# Patient Record
Sex: Female | Born: 1955 | Race: Black or African American | Hispanic: No | Marital: Married | Smoking: Never smoker
Health system: Southern US, Community
[De-identification: ages and names within clinical notes are randomized; demographics above are authoritative.]

## PROBLEM LIST (undated history)

## (undated) DIAGNOSIS — I428 Other cardiomyopathies: Secondary | ICD-10-CM

## (undated) DIAGNOSIS — J309 Allergic rhinitis, unspecified: Secondary | ICD-10-CM

## (undated) DIAGNOSIS — B20 Human immunodeficiency virus [HIV] disease: Secondary | ICD-10-CM

## (undated) DIAGNOSIS — D649 Anemia, unspecified: Secondary | ICD-10-CM

## (undated) DIAGNOSIS — I4729 Other ventricular tachycardia: Secondary | ICD-10-CM

## (undated) DIAGNOSIS — I5022 Chronic systolic (congestive) heart failure: Secondary | ICD-10-CM

## (undated) DIAGNOSIS — I472 Ventricular tachycardia: Secondary | ICD-10-CM

## (undated) DIAGNOSIS — F1011 Alcohol abuse, in remission: Secondary | ICD-10-CM

## (undated) DIAGNOSIS — I10 Essential (primary) hypertension: Secondary | ICD-10-CM

## (undated) HISTORY — DX: Human immunodeficiency virus (HIV) disease: B20

## (undated) HISTORY — DX: Other cardiomyopathies: I42.8

## (undated) HISTORY — DX: Anemia, unspecified: D64.9

## (undated) HISTORY — DX: Alcohol abuse, in remission: F10.11

## (undated) HISTORY — DX: Chronic systolic (congestive) heart failure: I50.22

## (undated) HISTORY — DX: Essential (primary) hypertension: I10

## (undated) HISTORY — DX: Ventricular tachycardia: I47.2

## (undated) HISTORY — PX: TUBAL LIGATION: SHX77

## (undated) HISTORY — DX: Other ventricular tachycardia: I47.29

## (undated) HISTORY — DX: Allergic rhinitis, unspecified: J30.9

## (undated) HISTORY — PX: CHOLECYSTECTOMY: SHX55

---

## 2003-11-03 ENCOUNTER — Inpatient Hospital Stay (HOSPITAL_COMMUNITY): Admission: EM | Admit: 2003-11-03 | Discharge: 2003-11-06 | Payer: Self-pay | Admitting: Emergency Medicine

## 2004-11-27 IMAGING — US US ABDOMEN COMPLETE
1 series · 13 of 25 positions shown · non-contrast
Comparison: none

CLINICAL DATA: Abdominal pain.
 ABDOMINAL ULTRASOUND, 11/03/03, 4104 HOURS

[Series 1: abdomen · 0.28mm/px · 13 of 89 slices shown]
[im 1/89]
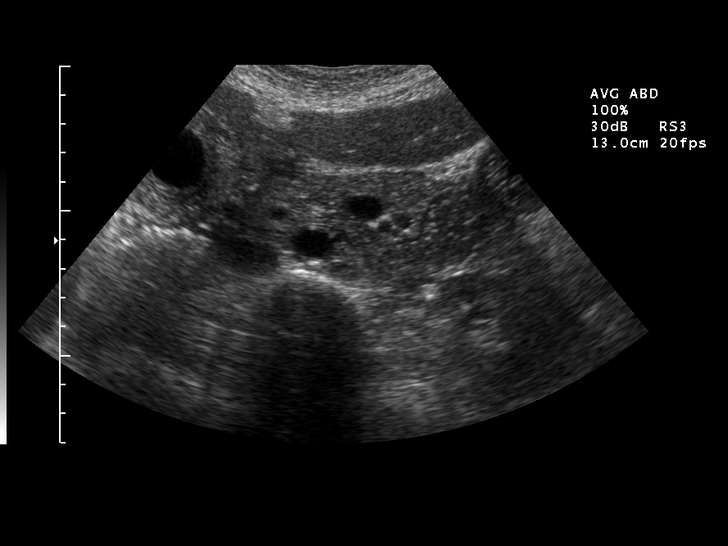
[im 8/89]
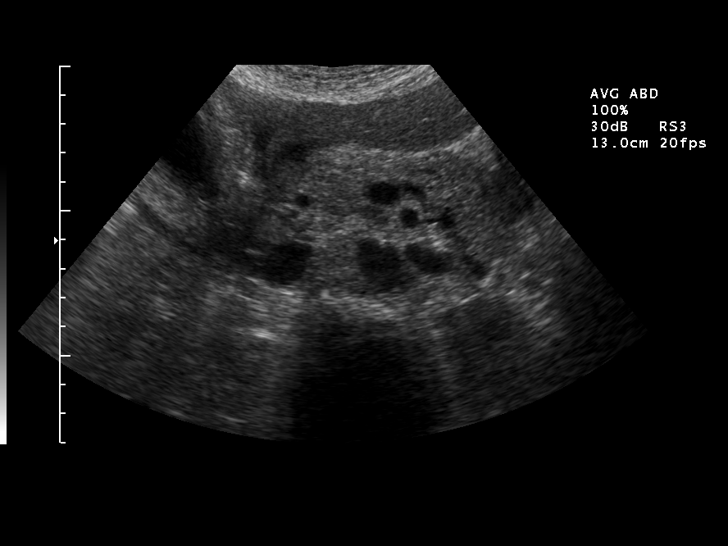
[im 15/89]
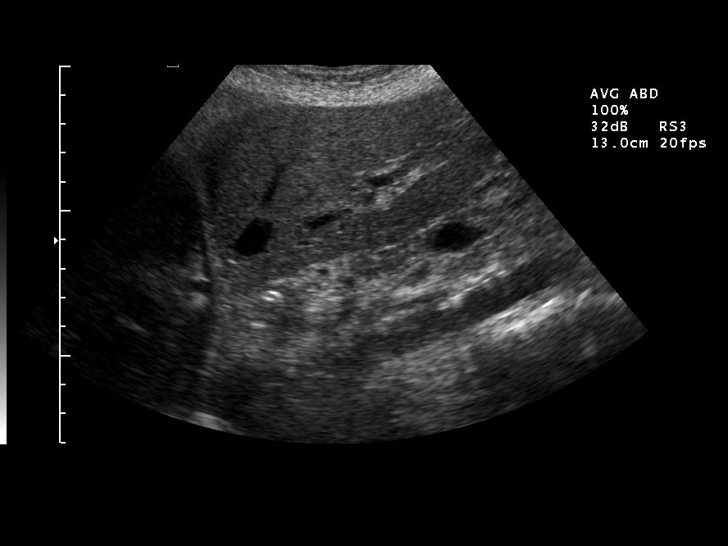
[im 23/89]
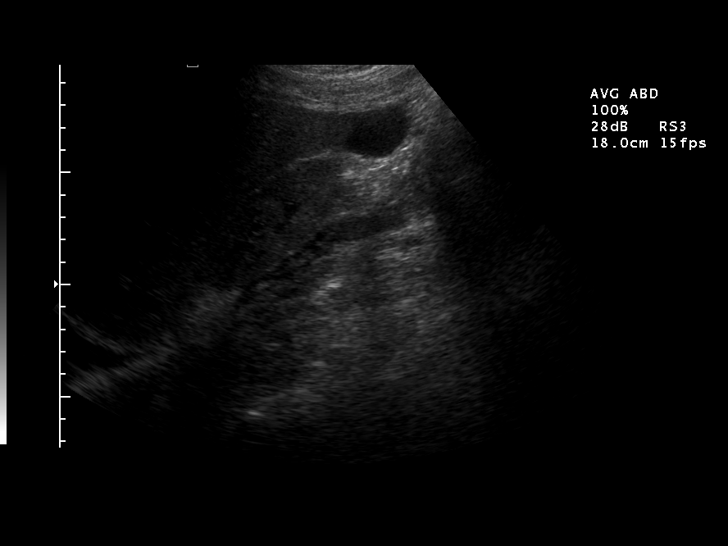
[im 30/89]
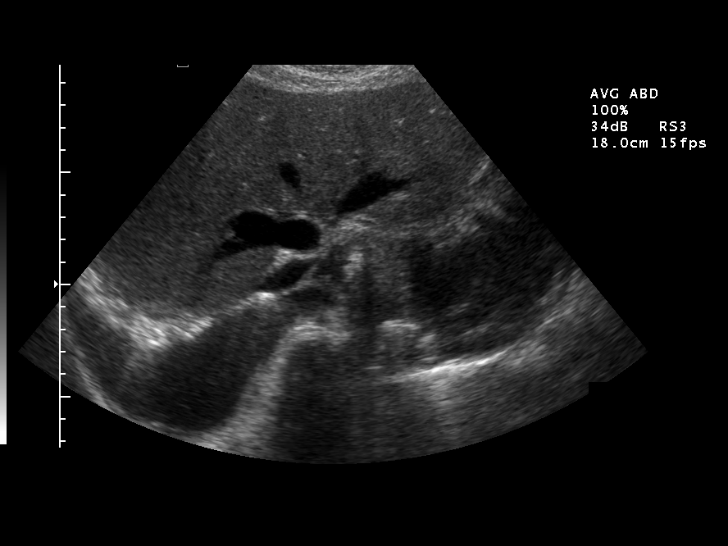
[im 37/89]
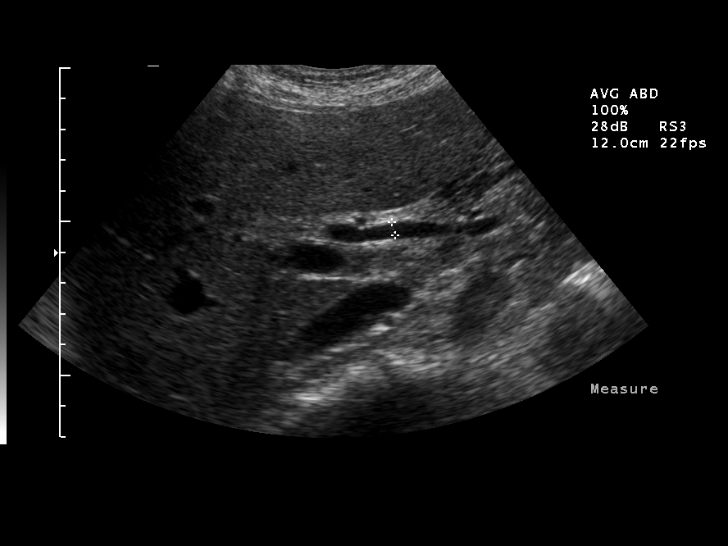
[im 45/89]
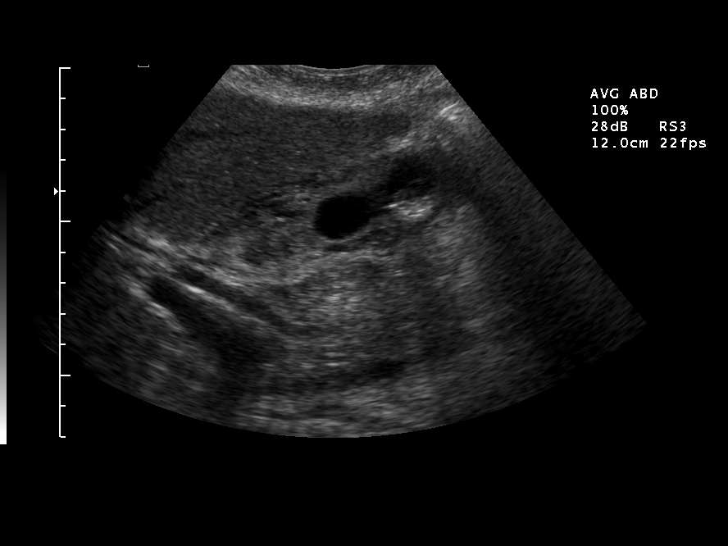
[im 52/89]
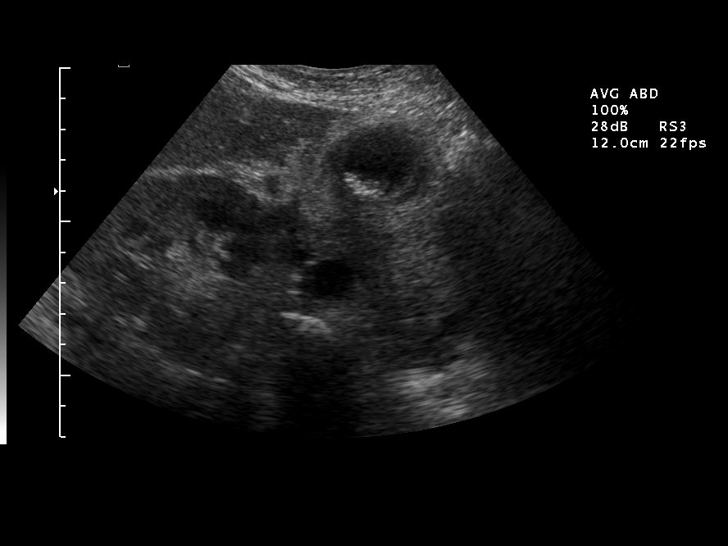
[im 59/89]
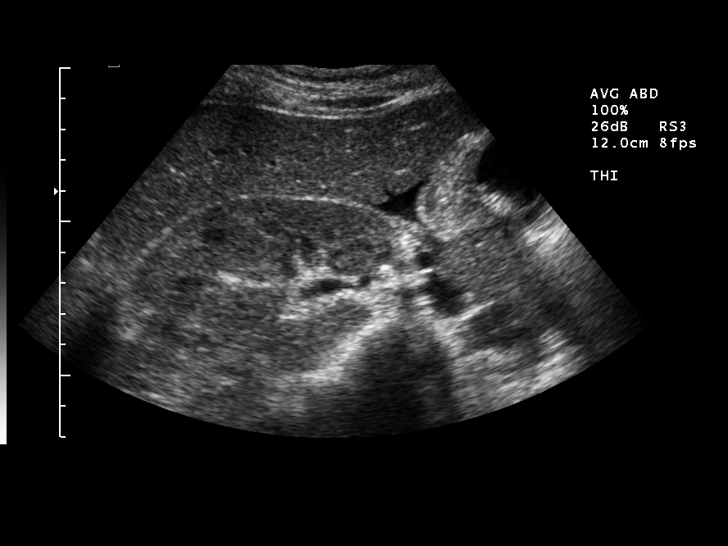
[im 67/89]
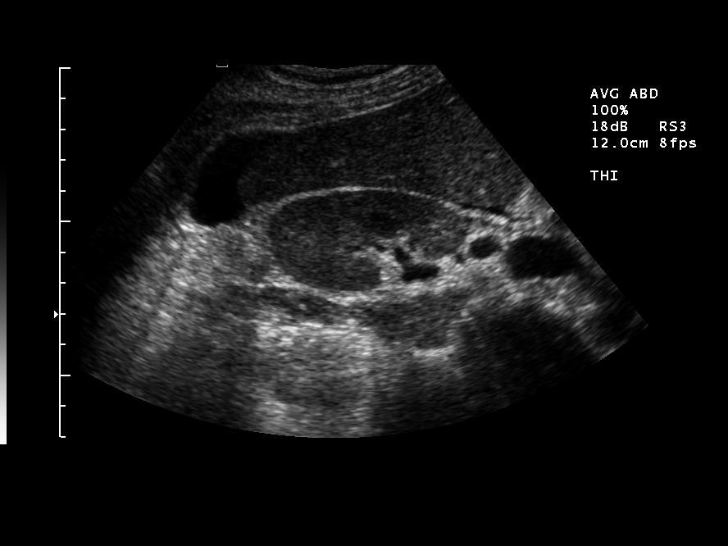
[im 74/89]
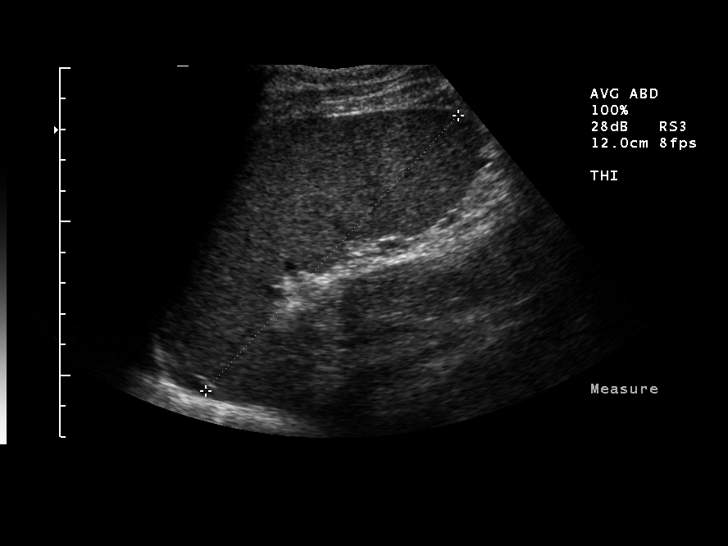
[im 81/89]
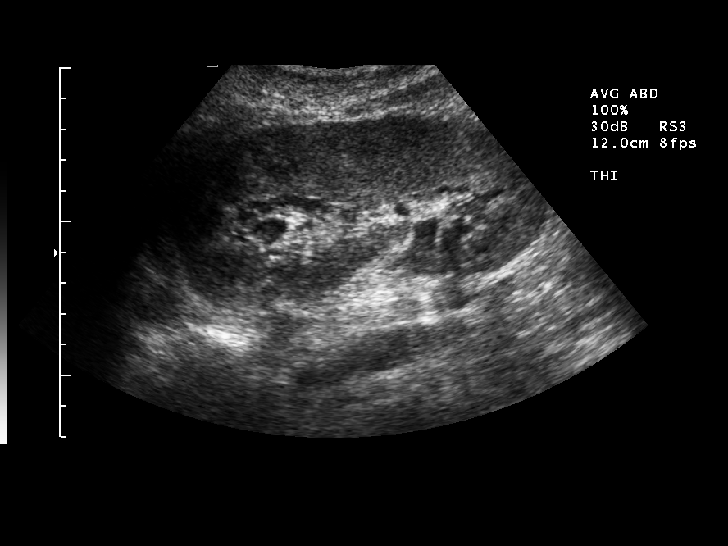
[im 89/89]
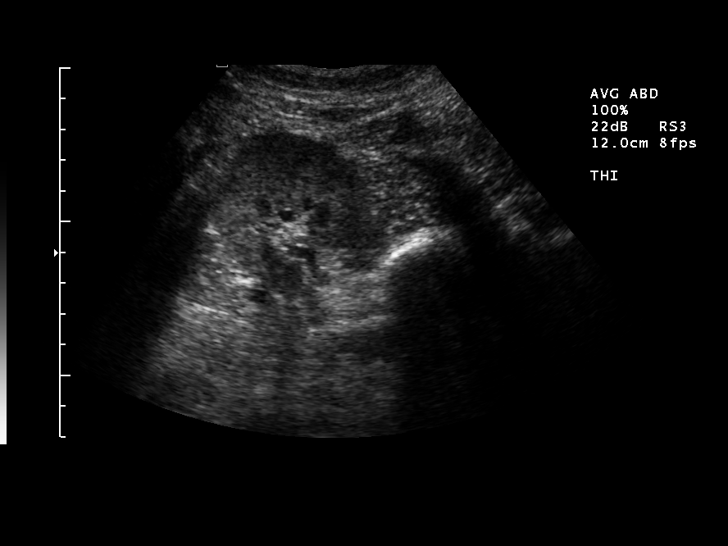

[13 of 25 positions shown; findings below may reference images not displayed]

FINDINGS: The gallbladder wall is significantly thickened and irregular, worrisome for inflammatory change.  The gallbladder wall is edematous.  Stones are seen within the gallbladder.  Pericholecystic fluid is also present.  The patient has somewhat diffuse abdominal tenderness and it was difficult to determine if there was a Murphy sign.  
 The common bile duct is 4.4 mm.
 The liver is normal in echogenicity and borderline enlarged measuring 18 cm in cephalocaudal measurement.
 The IVC is patent.  
 The pancreas is within normal limits without focal mass effect. 
 The spleen is within normal limits without focal mass effect.
 The right and left kidneys are 11.6 and 12.3 cm in length respectively without hydronephrosis or focal mass effect.
 Maximal aortic caliber in the proximal aorta is 1.5 cm.  The distal aorta was not clearly visualized due to overlying bowel gas.
 A small amount of free fluid is seen around the liver and gallbladder.
 A small left pleural effusion is present.
 IMPRESSION 
 Cholelithiasis with gallbladder wall thickening, wall edema, and pericholecystic fluid.  A Murphy sign was difficult to assess due to significant abdominal tenderness.  These findings are suggestive of acute cholecystitis but not entirely diagnostic.  Correlate clinically as for the need for nuclear medicine imaging. 
 Borderline hepatomegaly.
 Small amount of free fluid.
 Small left pleural effusion.

## 2006-09-08 ENCOUNTER — Ambulatory Visit: Payer: Self-pay | Admitting: Cardiology

## 2006-09-16 ENCOUNTER — Ambulatory Visit: Payer: Self-pay | Admitting: Cardiology

## 2006-09-21 ENCOUNTER — Ambulatory Visit: Payer: Self-pay | Admitting: Cardiology

## 2006-10-08 ENCOUNTER — Ambulatory Visit: Payer: Self-pay | Admitting: Cardiology

## 2007-04-05 ENCOUNTER — Ambulatory Visit: Payer: Self-pay | Admitting: Cardiology

## 2007-05-05 ENCOUNTER — Ambulatory Visit: Payer: Self-pay | Admitting: Cardiology

## 2007-05-24 ENCOUNTER — Ambulatory Visit: Payer: Self-pay | Admitting: Cardiology

## 2007-05-31 ENCOUNTER — Inpatient Hospital Stay (HOSPITAL_BASED_OUTPATIENT_CLINIC_OR_DEPARTMENT_OTHER): Admission: RE | Admit: 2007-05-31 | Discharge: 2007-05-31 | Payer: Self-pay | Admitting: Cardiology

## 2007-05-31 ENCOUNTER — Ambulatory Visit: Payer: Self-pay | Admitting: Cardiology

## 2007-06-13 ENCOUNTER — Ambulatory Visit: Payer: Self-pay | Admitting: Cardiology

## 2007-06-29 ENCOUNTER — Ambulatory Visit: Payer: Self-pay | Admitting: Cardiology

## 2007-09-22 ENCOUNTER — Ambulatory Visit: Payer: Self-pay | Admitting: Cardiology

## 2008-05-15 ENCOUNTER — Encounter: Payer: Self-pay | Admitting: Cardiology

## 2008-06-06 ENCOUNTER — Encounter: Payer: Self-pay | Admitting: Cardiology

## 2008-07-05 ENCOUNTER — Ambulatory Visit: Payer: Self-pay | Admitting: Cardiology

## 2008-07-24 ENCOUNTER — Ambulatory Visit: Payer: Self-pay | Admitting: Cardiology

## 2008-07-24 ENCOUNTER — Encounter: Payer: Self-pay | Admitting: Physician Assistant

## 2008-09-19 ENCOUNTER — Ambulatory Visit: Payer: Self-pay | Admitting: Internal Medicine

## 2008-09-19 DIAGNOSIS — I10 Essential (primary) hypertension: Secondary | ICD-10-CM | POA: Insufficient documentation

## 2008-09-19 DIAGNOSIS — I509 Heart failure, unspecified: Secondary | ICD-10-CM | POA: Insufficient documentation

## 2008-09-20 ENCOUNTER — Encounter (INDEPENDENT_AMBULATORY_CARE_PROVIDER_SITE_OTHER): Payer: Self-pay | Admitting: *Deleted

## 2009-02-22 ENCOUNTER — Encounter: Payer: Self-pay | Admitting: Cardiology

## 2009-04-05 ENCOUNTER — Encounter: Payer: Self-pay | Admitting: Cardiology

## 2009-04-05 DIAGNOSIS — I428 Other cardiomyopathies: Secondary | ICD-10-CM

## 2009-04-05 DIAGNOSIS — I517 Cardiomegaly: Secondary | ICD-10-CM | POA: Insufficient documentation

## 2009-04-05 DIAGNOSIS — I2510 Atherosclerotic heart disease of native coronary artery without angina pectoris: Secondary | ICD-10-CM | POA: Insufficient documentation

## 2009-06-03 ENCOUNTER — Encounter: Payer: Self-pay | Admitting: Cardiology

## 2009-06-20 ENCOUNTER — Ambulatory Visit: Payer: Self-pay | Admitting: Cardiology

## 2009-06-20 DIAGNOSIS — B20 Human immunodeficiency virus [HIV] disease: Secondary | ICD-10-CM

## 2010-03-18 ENCOUNTER — Ambulatory Visit: Payer: Self-pay | Admitting: Cardiology

## 2010-04-02 ENCOUNTER — Encounter: Payer: Self-pay | Admitting: Cardiology

## 2010-04-02 ENCOUNTER — Ambulatory Visit: Payer: Self-pay | Admitting: Cardiology

## 2010-06-24 NOTE — Assessment & Plan Note (Signed)
Summary: 6 MO FU R/S   Visit Type:  Follow-up Referring Provider:  Dr.Degent Primary Provider:  Dr. Illene Labrador [facility]); Glean Hess (Infection Diseases at [facility])   History of Present Illness: The patient returns for followup of her nonischemic cardiomyopathy. Since she was last seen here she saw Dr. Johney Frame.  They discussed an ICD. However, the patient has declined this at this point. She has also been diagnosed with HIV. This was found after she presented with weight loss and fatigue. She said that she now feels fine. She denies in particular any cardiovascular symptoms. She denies shortness of breath and is having no PND or orthopnea. She's had no weight gain or swelling. She has no chest discomfort, neck or arm discomfort. She has no palpitations, presyncope or syncope. She tolerates her medicines and avoid salt.  Preventive Screening-Counseling & Management  Alcohol-Tobacco     Smoking Status: never  Current Medications (verified): 1)  Ferrous Sulfate 325 (65 Fe) Mg Tabs (Ferrous Sulfate) .... Take 1 Tablet By Mouth Once A  Day 2)  Furosemide 40 Mg Tabs (Furosemide) .... Take 1 Tablet By Mouth Once A Day 3)  Lisinopril 20 Mg Tabs (Lisinopril) .... Take 1 Tablet By Mouth Once A Day 4)  Spironolactone 25 Mg Tabs (Spironolactone) .... Take 1 Tablet By Mouth Once A Day 5)  Coricidin Hbp Cough/cold 4-30 Mg Tabs (Chlorpheniramine-Dm) .... As Needed 6)  Eq Omeprazole 20 Mg Tbec (Omeprazole) .Marland Kitchen.. 1 By Mouth Once Daily 7)  Carvedilol 25 Mg Tabs (Carvedilol) .... Take One Tablet Two Times A Day 8)  Fish Oil   Oil (Fish Oil) .Marland Kitchen.. 1 By Mouth Once Daily 9)  Isentress 400 Mg Tabs (Raltegravir Potassium) .... Take 1 Tablet By Mouth Two Times A Day 10)  Norvir 100 Mg Tabs (Ritonavir) .... Take 1 Tablet By Mouth Two Times A Day 11)  Prezista 600 Mg Tabs (Darunavir Ethanolate) .... Take 1 Tablet By Mouth Two Times A Day 12)  Combivir 150-300 Mg Tabs (Lamivudine-Zidovudine) .... Take 1  Tablet By Mouth Two Times A Day 13)  Loratadine 10 Mg Tabs (Loratadine) .... Take 1 Tablet By Mouth Once A Day 14)  Nystatin 100000 Unit/ml Susp (Nystatin) .... Use As Directed  Allergies: 1)  Levaquin  Comments:  Nurse/Medical Assistant: Pt didn't bring medication list. Obtain medicine list from pt's pharmacies.  Cyril Loosen, RN, BSN (June 20, 2009 9:53 AM)  Past History:  Past Medical History: Nonischemic cardiopathy A.  normal coronary arteries, January 2009 B.  ejection fraction 30%; global hypokinesis Chronic systolic heart failure Hypertension Nonsustained ventricular tachycardia Chronic anemia Remote alcohol use Allergic sinusitis HIV  Past Surgical History: Cholecystectomy Tubal ligation  Review of Systems       As stated in the HPI and negative for all other systems.   Vital Signs:  Patient profile:   55 year old female Height:      61 inches Weight:      132.25 pounds BMI:     25.08 Pulse rate:   83 / minute BP sitting:   107 / 70  (left arm) Cuff size:   regular  Vitals Entered By: Cyril Loosen, RN, BSN (June 20, 2009 9:32 AM)  Nutrition Counseling: Patient's BMI is greater than 25 and therefore counseled on weight management options. Comments Follow up visit. Pt states recently diagnosed with HIV. She seeing ID clinic at [facility].    Physical Exam  General:  Well developed, well nourished, in no acute distress. Head:  normocephalic and atraumatic Eyes:  PERRLA/EOM intact; conjunctiva and lids normal. Neck:  Neck supple, no JVD. No masses, thyromegaly or abnormal cervical nodes. Chest Wall:  no deformities or breast masses noted Lungs:  Clear bilaterally to auscultation and percussion. Heart:  Non-displaced PMI, chest non-tender; regular rate and rhythm, S1, S2 without murmurs, rubs or gallops. Carotid upstroke normal, no bruit. Normal abdominal aortic size, no bruits. Femorals normal pulses, no bruits. Pedals normal pulses. No edema, no  varicosities. Abdomen:  Bowel sounds positive; abdomen soft and non-tender without masses, organomegaly, or hernias noted. No hepatosplenomegaly. Msk:  Back normal, normal gait. Muscle strength and tone normal. Extremities:  No clubbing or cyanosis. Neurologic:  Alert and oriented x 3. Skin:  Intact without lesions or rashes. Psych:  Normal affect.   Impression & Recommendations:  Problem # 1:  CARDIOMYOPATHY, DILATED (ICD-425.4) The patient has class I symptoms.  She is on an excellent medical regimen. Her blood pressure is borderline. Given the absence of symptoms and this I will not titrate her meds further. She does not want to consider an ICD at this time.  Problem # 2:  HYPERTENSION, BENIGN (ICD-401.1) Her blood pressure is controlled with the meds as listed.  Problem # 3:  HIV TEST POSITIVE (ICD-042) This is a new diagnosis for the patient. Her treating physicians are aware of her cardiac diagnosis.  Patient Instructions: 1)  Your physician recommends that you continue on your current medications as directed. Please refer to the Current Medication list given to you today. 2)  Your physician wants you to follow-up in: 6 MONTHS. You will receive a reminder letter in the mail about two months in advance. If you don't receive a letter, please call our office to schedule the follow-up appointment.

## 2010-06-24 NOTE — Letter (Signed)
Summary: Appointment- Rescheduled  [facility] at [facility] [address]   [place], [place] [address]   Phone: [phone]  Fax: [phone]     June 03, 2009 MRN: [id number]      [facility] [address] [place], [place]  [address]      Dear Ms. Coolman,   Due to a change in our office schedule, your appointment on JAN 27th with  Dr. Andee Lineman has been changed to Jan 28th with Dr. Antoine Poche @ 9:15.   Please call the office only if you wish not to keep appointment.   We look forward to participating in your health care needs.       Sincerely,  Glass blower/designer

## 2010-06-24 NOTE — Assessment & Plan Note (Signed)
Summary: 6 mo fu per july reminder   Visit Type:  Follow-up Referring Provider:  Dr.Aliviyah Malanga Primary Provider:  Path([place])   History of Present Illness: the patient is a 55 year old female with history of HIV [reportedly high CD4 count and undetectable viral load], nonischemic cardiomyopathy ejection fraction of 20% prior to aggressive medical heart failure treatment. The patient has been seen by Dr. Johney Frame for evaluation of ICD but she has declined. On medical therapy the patient has actually improved significantly. She now walks 2-1/2 miles a day. She denies any shortness of breath and she didn't like a class I.. She denies any palpitations presyncope or syncope. She denies any chest pain.  Electrocardiogram was performed in the office and reviewed. Reports have  been entered  Preventive Screening-Counseling & Management  Alcohol-Tobacco     Smoking Status: never  Current Medications (verified): 1)  Ferrous Sulfate 325 (65 Fe) Mg Tabs (Ferrous Sulfate) .... Take 1 Tablet By Mouth Once A  Day 2)  Furosemide 40 Mg Tabs (Furosemide) .... Take 1 Tablet By Mouth Once A Day 3)  Lisinopril 20 Mg Tabs (Lisinopril) .... Take 1 Tablet By Mouth Once A Day 4)  Spironolactone 25 Mg Tabs (Spironolactone) .... Take 1 Tablet By Mouth Once A Day 5)  Coricidin Hbp Cough/cold 4-30 Mg Tabs (Chlorpheniramine-Dm) .... As Needed 6)  Eq Omeprazole 20 Mg Tbec (Omeprazole) .Marland Kitchen.. 1 By Mouth Once Daily 7)  Carvedilol 25 Mg Tabs (Carvedilol) .... Take One Tablet Two Times A Day 8)  Fish Oil   Oil (Fish Oil) .Marland Kitchen.. 1 By Mouth Once Daily 9)  Isentress 400 Mg Tabs (Raltegravir Potassium) .... Take 1 Tablet By Mouth Two Times A Day 10)  Norvir 100 Mg Tabs (Ritonavir) .... Take 1 Tablet By Mouth Two Times A Day 11)  Prezista 600 Mg Tabs (Darunavir Ethanolate) .... Take 1 Tablet By Mouth Two Times A Day 12)  Combivir 150-300 Mg Tabs (Lamivudine-Zidovudine) .... Take 1 Tablet By Mouth Two Times A Day 13)  Loratadine 10 Mg  Tabs (Loratadine) .... Take 1 Tablet By Mouth Once A Day 14)  Nystatin 100000 Unit/ml Susp (Nystatin) .... Use As Directed  Allergies (verified): 1)  Levaquin  Comments:  Nurse/Medical Assistant: The patient is currently on medications but does not know the name or dosage at this time. Instructed to contact our office with details. Will update medication list at that time.  Past History:  Past Surgical History: Last updated: 06/20/2009 Cholecystectomy Tubal ligation  Family History: Last updated: 09-24-08 Father died at age 69 of CHF.  Multiple paternal relatives with "heart problems"  Social History: Last updated: 24-Sep-2008 Lives in [place] with daughter.  Works as a Comptroller.  Tob- none.  H/o moderate ETOH use but quit x 8 years.  Denies drugs.  Risk Factors: Smoking Status: never (03/18/2010)  Past Medical History: Nonischemic cardiopathy A.  normal coronary arteries, January 2009 B.  ejection fraction 20%; global hypokinesis Chronic systolic heart failure Hypertension Nonsustained ventricular tachycardia declined ICD therapy Chronic anemia Remote alcohol use Allergic sinusitis HIV undetectable viral load 2011  Vital Signs:  Patient profile:   56 year old female Height:      61 inches Weight:      164 pounds BMI:     31.10 Pulse rate:   93 / minute BP sitting:   137 / 91  (left arm) Cuff size:   regular  Vitals Entered By: Carlye Grippe (March 18, 2010 9:33 AM)  Nutrition Counseling: Patient's BMI is  greater than 25 and therefore counseled on weight management options.  Physical Exam  Additional Exam:  General: Well-developed, well-nourished in no distress head: Normocephalic and atraumatic eyes PERRLA/EOMI intact, conjunctiva and lids normal nose: No deformity or lesions mouth normal dentition, normal posterior pharynx neck: Supple, no JVD.  No masses, thyromegaly or abnormal cervical nodes lungs: Normal breath sounds bilaterally without  wheezing.  Normal percussion heart: regular rate and rhythm with normal S1 and S2, no S3 or S4.  PMI is normal.  No pathological murmurs abdomen: Normal bowel sounds, abdomen is soft and nontender without masses, organomegaly or hernias noted.  No hepatosplenomegaly musculoskeletal: Back normal, normal gait muscle strength and tone normal pulsus: Pulse is normal in all 4 extremities Extremities: No peripheral pitting edema neurologic: Alert and oriented x 3 skin: Intact without lesions or rashes cervical nodes: No significant adenopathy psychologic: Normal affect    EKG  Procedure date:  03/18/2010  Findings:      normal sinus rhythm. Nonspecific T-wave changes heart rate 86 beats per minute  Impression & Recommendations:  Problem # 1:  HIV TEST POSITIVE (ICD-042) viral load undetectable and followed at [facility] patient is on HAART  Problem # 2:  CARDIOMYOPATHY, DILATED (ICD-425.4) the patient has a nonischemic cardiomyopathy for functional class however has markedly improved. She is on maximal heart failure therapy and we will recheck an echocardiogram to assess her ejection fraction Her updated medication list for this problem includes:    Furosemide 40 Mg Tabs (Furosemide) .Marland Kitchen... Take 1 tablet by mouth once a day    Lisinopril 20 Mg Tabs (Lisinopril) .Marland Kitchen... Take 1 tablet by mouth once a day    Spironolactone 25 Mg Tabs (Spironolactone) .Marland Kitchen... Take 1 tablet by mouth once a day    Carvedilol 25 Mg Tabs (Carvedilol) .Marland Kitchen... Take one tablet two times a day  Problem # 3:  HYPERTENSION, BENIGN (ICD-401.1) blood pressure is controlled and continue medical therapy. Her updated medication list for this problem includes:    Furosemide 40 Mg Tabs (Furosemide) .Marland Kitchen... Take 1 tablet by mouth once a day    Lisinopril 20 Mg Tabs (Lisinopril) .Marland Kitchen... Take 1 tablet by mouth once a day    Spironolactone 25 Mg Tabs (Spironolactone) .Marland Kitchen... Take 1 tablet by mouth once a day    Carvedilol 25 Mg  Tabs (Carvedilol) .Marland Kitchen... Take one tablet two times a day  Other Orders: EKG w/ Interpretation (93000) 2-D Echocardiogram (2D Echo)  Patient Instructions: 1)  Echo 2)  Follow up in  6 months Prescriptions: CARVEDILOL 25 MG TABS (CARVEDILOL) Take one tablet two times a day  #60 x 6   Entered by:   Hoover Brunette, LPN   Authorized by:   Lewayne Bunting, MD, [facility]   Signed by:   Hoover Brunette, LPN on 95/63/8756   Method used:   Electronically to        [facility]  [address] [phone]* (retail)       [address]       [place], [place]  [address]       Ph: [phone]       Fax: [phone]   RxID:   [id number] EQ OMEPRAZOLE 20 MG TBEC (OMEPRAZOLE) 1 by mouth once daily  #30 x 1   Entered by:   Hoover Brunette, LPN   Authorized by:   Lewayne Bunting, MD, [facility]   Signed by:   Hoover Brunette, LPN on 83/15/1761   Method used:   [facility]  [address] [phone]* (retail)       [address]       [place], [place]  [address]       Ph: [phone]       Fax: [phone]   RxID:   [id number] SPIRONOLACTONE 25 MG TABS (SPIRONOLACTONE) Take 1 tablet by mouth once a day  #30 x 6   Entered by:   Hoover Brunette, LPN   Authorized by:   Lewayne Bunting, MD, [facility]   Signed by:   Hoover Brunette, LPN on 23/76/2831   Method used:   [facility]        [facility]  [address] [phone]* (retail)       [address]       [place], [place]  [address]       Ph: [phone]       Fax: [phone]   RxID:   [id number] LISINOPRIL 20 MG TABS (LISINOPRIL) Take 1 tablet by mouth once a day  #30 x 6   Entered by:   Hoover Brunette, LPN   Authorized by:   Lewayne Bunting, MD, [facility]   Signed by:   Hoover Brunette, LPN on 38/02/1750   Method used:   [facility]        [facility]  [address] [phone]* (retail)       [address]       [place], [place]  [address]       Ph: [phone]       Fax: [phone]   RxID:   [id number] FUROSEMIDE 40 MG TABS (FUROSEMIDE) Take 1 tablet by mouth once a day  #30 x 6   Entered by:   Hoover Brunette, LPN    Authorized by:   Lewayne Bunting, MD, [facility]   Signed by:   Hoover Brunette, LPN on 98/33/8250   Method used:   [facility]        [facility]  [address] [phone]* (retail)       [address]       [place], [place]  [address]       Ph: [phone]       Fax: [phone]   RxID:   [id number] FERROUS SULFATE 325 (65 FE) MG TABS (FERROUS SULFATE) Take 1 tablet by mouth once a  day  #30 Tablet x 1   Entered by:   Hoover Brunette, LPN   Authorized by:   Lewayne Bunting, MD, [facility]   Signed by:   Hoover Brunette, LPN on 19/41/7408   Method used:   [facility]        [facility]  [address] [phone]* (retail)       [address]       [place], [place]  [address]       Ph: [phone]       Fax: [phone]   RxID:   [id number]

## 2010-08-15 ENCOUNTER — Other Ambulatory Visit: Payer: Self-pay | Admitting: Cardiology

## 2010-08-18 ENCOUNTER — Other Ambulatory Visit: Payer: Self-pay | Admitting: *Deleted

## 2010-08-18 MED ORDER — OMEPRAZOLE 20 MG PO CPDR: 20.0000 mg | DELAYED_RELEASE_CAPSULE | Freq: Every day | ORAL | Status: DC

## 2010-08-21 NOTE — Telephone Encounter (Signed)
[place] 

## 2010-10-07 NOTE — Cardiovascular Report (Signed)
Penny [name], Penny [name]            ACCOUNT NO.:  [id number]   MEDICAL RECORD NO.:  [id number]          PATIENT TYPE:  OIB   LOCATION:  1962                         FACILITY:  [facility]   PHYSICIAN:  Rollene Rotunda, MD, FACCDATE OF BIRTH:  04-26-1956   DATE OF PROCEDURE:  05/31/2007  DATE OF DISCHARGE:                            CARDIAC CATHETERIZATION   CARDIOLOGIST:  Learta Codding, M.D., in [place].   PROCEDURE:  Left and right heart catheterization/coronary arteriography.   INDICATIONS:  Evaluate patient with cardiomyopathy of unclear etiology.   PROCEDURE NOTE:  Left heart catheterization performed via the right  femoral artery, right heart catheterization was performed via right  femoral vein.  Both vessels were cannulated using anterior wall  puncture.  A #4-French arterial sheath and #7-French venous sheath were  inserted via the modified Seldinger technique.  Preformed Judkins,  pigtail and a Swan-Ganz catheter were utilized.  The patient tolerated  the procedure well and left lab in stable condition.   RESULTS:  Hemodynamics:  RA mean 3 , RV 28/3, PA 23/9 with a mean of 15,  pulmonary capillary pressure mean 6, LV 150/14, AO 154/83.  Cardiac  output/cardiac index 3.3/1.93.  Coronaries:  Left main had separate ostia.  The LAD was normal.  First  and second diagonal were small and normal.  Circumflex was dominant.  In  the AV groove, it was normal.  There was mid obtuse marginal which was  large and normal.  Ramus intermediate was large and normal.  The PDA was  small to moderate sized and normal.  The right coronary artery is  nondominant and normal.  Left ventriculogram:  Left ventriculogram was obtained in the RAO  projection.  The EF was 30% with a global hypokinesis.   CONCLUSION:  Normal coronary arteries.  Nonischemic cardiomyopathy.  The  right heart pressures are within normal limits.   PLAN:  The patient will continue to have medical management of her  nonischemic  cardiomyopathy.      Rollene Rotunda, MD, [facility]  Electronically Signed     JH/MEDQ  D:  05/31/2007  T:  05/31/2007  Job:  [id number]   cc:   Rayburn Ma

## 2010-10-07 NOTE — Assessment & Plan Note (Signed)
[facility]                          [facility] NOTE   NAME:[name], [name]                   MRN:          [id number]  DATE:06/13/2007                            DOB:          [birth year]    PRIMARY CARE PHYSICIAN:  She is followed at [facility]  [place].   CARDIOLOGIST:  Dr. Andee Lineman.   REASON FOR VISIT:  Post catheterization follow-up.   HISTORY OF PRESENT ILLNESS:  Penny [name] is a 55 year old female [name] history of severe cardiomyopathy with previous EF of 20% improved  to 35-40% by echocardiogram in September 2008.  She also has a history  of non-sustained ventricular tachycardia.  There had always been concern  that she had underlying ischemic heart disease versus hypertensive heart  disease.  She was last seen by Dr. Andee Lineman on May 24, 2007.  At that  time, she was willing to proceed with cardiac catheterization.   She presented to [facility] October 29, 2007 for outpatient cardiac  catheterization.  This was done by Dr. Antoine Poche and revealed normal  coronaries and an EF 30% with global hypokinesia.   Penny [name] presents for follow-up.  Overall she is doing well.  She  denies any significant shortness of breath or chest pain.  She describes  NYHA class I symptoms.  She denies orthopnea, PND or pedal edema.  Denies any syncope or palpitations.   CURRENT MEDICATIONS:  Spironolactone 25 mg daily.  Ferrous sulfate 325  mg daily, aspirin 81 mg daily, Lasix 40 mg day, Lexapro 4 mg daily,  carvedilol 12.5 mg 2 tabs b.i.d.   PHYSICAL EXAM:  She is a well-nourished, well-developed female.  Blood pressure 160/101, pulse 85, weight 145.6 pounds.  HEENT is normal.  Neck without JVD.  CARDIAC:  Normal S1, S2.  Regular rate and rhythm without murmur.  LUNGS:  Clear to auscultation bilaterally without wheezing, rhonchi,  rales up.  ABDOMEN:  Soft.  Nontender with normoactive bowel sounds.  EXTREMITIES:  No edema.  Right femoral arteriotomy site without hematoma  or bruit.  NEUROLOGIC:  She is alert and oriented x3.  Cranial nerves 2-12 grossly  intact.   Electrocardiogram reveals sinus rhythm rate of 88 normal axis LVH.  Nonspecific ST-T wave change.  No significant change since previous  tracing.   IMPRESSION:  1. Nonischemic cardiomyopathy with an EF of 35-40%.  (Echocardiograms      May 05, 2007)  2. Chronic systolic congestive heart failure.  New [place] Heart      Association Class I symptoms.  3. Normal coronary arteries by cardiac catheterization October 29, 2007.  4. Hypertension.  5. History of nonsustained ventricular tachycardia.  6. Chronic anemia.  7. History of remote alcohol use.  8. History of sudden cardiac death in family with sudden death in      older family members.   PLAN:  Penny [name] presents to Penny office today for follow-up post  catheterization.  She had normal coronaries by cardiac catheterization.  At her office visit on December 30, Dr. Andee Lineman noted that if she has  ischemic  heart disease, she will need further evaluation of her  nonsustained ventricular tachycardia.  He felt that if she has a  nonischemic cardiomyopathy, up titration of her medications would be  indicated without necessitating defibrillator implant.  At this point in  time, we plan to:   1. Increase her lisinopril to 40 mg a day.  Penny [name] tells me she      is has not taken her medications today.  2. We will bring her back in 1 week for a BMET to follow-up on her      renal function potassium with Penny increase in her lisinopril.  3. We will bring her back in 2 weeks for a blood pressure check with      Penny nurse.  If her blood pressure remains elevated, we will      initiate nitrate therapy.  At that point, we will try to work      towards initiating Penny equivalent of BiDil.  4. We will bring Penny [name] back in 4 to 6 weeks for routine follow-      up.  She will  likely need another echocardiogram at some point in      Penny next several months to reassess her LV function given Penny up      titration of medical therapy.      Tereso Newcomer, PA-C  Electronically Signed      Learta Codding, MD,FACC  Electronically Signed   SW/MedQ  DD: 06/13/2007  DT: 06/13/2007  Job #: [id number]

## 2010-10-07 NOTE — Assessment & Plan Note (Signed)
[facility]                          [facility]   NAME:Hirata, JENAVIEVE FREDA                   MRN:          [id number]  DATE:09/22/2007                            DOB:          Sep 10, 1955    CARDIOLOGIST:  Dr. Lewayne Bunting.   REASON FOR VISIT:  Six week follow-up.   HISTORY OF PRESENT ILLNESS:  Ms. Martie Round is a 55 year old female patient  with a history of nonischemic cardiomyopathy with an EF of 20% improved  to 35-40% by last echocardiogram in December 2008.  She has normal  coronaries by catheterization January 2009.  I last saw her June 13, 2007.  In the office today she notes she is doing well.  She denies any  shortness of breath.  She exhibits NYHA class I symptoms.  She denies  any chest pain.  She denies orthopnea, PND, pedal edema, syncope, near  syncope or palpitations.  Since last being seen we tried to initiate  Isordil.  This caused significant GI upset.  She changed her lisinopril  herself to 40 mg at bedtime and 10 mg in the morning.  She also changed  her carvedilol to 25 mg two tablets at bedtime.   CURRENT MEDICATIONS:  1. Spironolactone 25 mg daily.  2. Ferrous sulfate 325 mg daily.  3. Aspirin 81 mg daily.  4. Lasix 40 mg daily.  5. Lisinopril 40 mg at bedtime.  6. Lisinopril 10 mg in the morning.  7. Claritin 10 mg q.h.s.  8. Carvedilol 25 mg two tablets at bedtime.   PHYSICAL EXAMINATION:  GENERAL:  She is a well-nourished, well-developed  female in no distress.  VITAL SIGNS:  Blood pressure 126/85, pulse 74, weight 143.2 pounds.  HEENT:  Normal.  NECK:  Without JVD.  CARDIAC:  Normal S1, S2, regular rate and rhythm without murmur.  LUNGS:  Clear to auscultation bilaterally.  ABDOMEN:  Soft, nontender.  EXTREMITIES:  Without edema.  NEUROLOGIC:  She is alert and oriented x3.  Cranial II-XII grossly  intact.   IMPRESSION:  1. Nonischemic cardiomyopathy with ejection fraction 35-40%.      a.      Chronic systolic congestive heart failure with [place]       Heart Association class I symptoms.  2. Normal coronary arteries by catheterization June 2009.  3. Hypertension, controlled.  4. History of nonsustained ventricular tachycardia.  5. Chronic anemia.  6. History of remote alcohol use.  7. Family history of sudden cardiac death.   PLAN:  1. Ms. Martie Round returns to the office today for follow-up for her      chronic systolic congestive heart failure secondary to nonischemic      cardiomyopathy.  She is doing well.  She is optivolemic on exam,      and she exhibits NYHA class I symptoms.  No medication changes need      to be made today.  However, she has changed her medicines around      somewhat, and we need to adjust those to treat her specifically for      congestive heart failure.  2.  She can continue on 40 mg of lisinopril at night and lisinopril 10      mg in the morning.  3. She would like a once daily formulation of carvedilol.  She has      fairly good prescription coverage.  We will change her carvedilol      to Coreg CR 80 mg a day.  Until she fills that prescription she      should take carvedilol 25 mg b.i.d.  4. We will check a BMET today to follow up on her renal function and      potassium as she is on spironolactone.  5. We will see her back in follow-up in six months.  We will check an      echocardiogram prior to that visit to reassess her LV function.      Tereso Newcomer, PA-C  Electronically Signed      Learta Codding, MD,FACC  Electronically Signed   SW/MedQ  DD: 09/22/2007  DT: 09/22/2007  Job #: [id number]   cc:   [facility]

## 2010-10-07 NOTE — Assessment & Plan Note (Signed)
[facility]                          [facility]   NAME:Haycraft, Penny [name]                   MRN:          [id number]  DATE:04/05/2007                            DOB:          1955/06/16    PRIMARY CARDIOLOGIST:  Learta Codding, MD, [facility]   REASON FOR VISIT:  Six month followup.   The patient continues to do extremely well from a cardiovascular  standpoint, with no interim development of signs/symptoms suggestive of  decompensated congestive heart failure.   The patient reports compliance with her medications, as well as dietary  sodium restriction. Of note, she has gained two pounds since our last  visit, but presents with no symptoms suggestive of congestive heart  failure.   CURRENT MEDICATIONS:  1. Spironolactone 25 daily.  2. Ferrous sulfate 325 daily.  3. Aspirin 81 daily.  4. Lisinopril 40 daily.  5. Carvedilol 18.75 daily.  6. Lasix 40 daily.   PHYSICAL EXAMINATION:  Blood pressure 134/88, pulse 87 and regular,  weight 148.6 (up 2 pounds).  GENERAL: A 55 -year-old female sitting upright in no distress.  HEENT: Normocephalic, atraumatic.  NECK: Palpable bilateral carotid pulses without bruits; no JVD at 90  degrees.  LUNGS:  Clear to auscultation in all fields.  HEART: Regular rate and rhythm (S1, S2). No significant murmurs.  ABDOMEN: Soft, nontender.  EXTREMITIES: No pedal edema.  NEURO: No focal deficit.   IMPRESSION:  1. Severe cardiomyopathy.      a.     Ejection fraction of 20%, by previous studies.      b.     Worrisome for ischemic etiology, per exercise stress       Cardiolite perfusion imaging in April 2008.      c.     Current [place] Heart Association Class I heart failure.  2. History of nonsustained ventricular tachycardia.  3. Hypertension.  4. Chronic anemia.  5. Remote history of alcohol use.   PLAN:  1. Followup 2D echo, as previously recommended, for assessment of any      left ventricular  recovery. If this shows a persistently depressed      left ventricular function, then we will need to revisit the option      of proceeding with diagnostic coronary angiography. We will review      this with the patient when she returns for  clinic followup.  2. Up titrate Coreg to target goal of 25 mg b.i.d. The patient has      been tolerating this medication quite nicely.  3. Surveillance blood work with a complete metabolic profile and BNP      level, particularly in light of the fact that she is on      spironolactone and furosemide as well as an ACE inhibitor.  4. Return clinic followup with myself and Dr. Andee Lineman in one month, for      review of echocardiogram results and further recommendations with      particular emphasis on whether or not to proceed with cardiac      catheterization, in light of her previous abnormal perfusion study.  5. Renew current medications with the addition of Claritin 10 mg      daily. The patient states that this has been effective in the past      for treatment of her chronic nasal congestion.      Gene Serpe, PA-C  Electronically Signed      Learta Codding, MD,FACC  Electronically Signed   GS/MedQ  DD: 04/05/2007  DT: 04/06/2007  Job #: [id number]

## 2010-10-07 NOTE — Assessment & Plan Note (Signed)
[facility]                          [facility]   NAME:Penny [name], Penny [name]                   MRN:          [id number]  DATE:07/05/2008                            DOB:          [birth year]    PRIMARY CARDIOLOGIST:  Learta Codding, MD,FACC   REASON FOR VISIT:  Scheduled followup.   [name] returns to our clinic for ongoing management of nonischemic  cardiomyopathy.  Her cardiac catheterization in January 2009 indicated  normal coronary arteries, but moderate LVD (EF 30%) with global  hypokinesis.  She has not had a followup echocardiogram for reassessment  of her LV function.   Clinically, she is doing extremely well.  She presents with symptoms  consistent with NYHA class I heart failure.  She also denies any chest  pain or tachy palpitations.   The patient was recently briefly treated here at [facility] ED  for pneumonia.  Review of blood work from that visit of 1 month ago  indicates normal renal function, sodium 130, and a potassium of 3.6.  WBC 12.5 with a hemoglobin of 11, hematocrit 33 (MCV 85), and platelets  130.   CURRENT MEDICATIONS:  1. Spironolactone 25 daily.  2. Lasix 40 daily.  3. Lisinopril 10 q.a.m.  4. Coreg CR 80 q.a.m.  5. Omeprazole 20 daily.  6. Ferrous sulfate 325 daily.   PHYSICAL EXAMINATION:  VITAL SIGNS:  Blood pressure 155/97, pulse 78 and  regular, weight 130 (down 13 pounds).  GENERAL:  A 55 year old female sitting upright in no distress.  HEENT:  Normocephalic, atraumatic.  NECK:  Palpable bilateral carotid pulses without bruits; no JVD at 90  degrees.  LUNGS:  Clear to auscultation in all fields.  HEART:  Regular rate and rhythm.  No significant murmurs.  No rubs.  ABDOMEN:  Benign.  EXTREMITIES:  No edema.  NEUROLOGIC:  No focal deficits.   IMPRESSION:  1. Nonischemic cardiomyopathy.      a.     EF 30% by catheterization, January 2009.      b.     Normal coronary arteries.  2. Hypertension, uncontrolled.  3. Chronic systolic heart failure.      a.     NYHA class I symptoms.  4. History of NSVT.  5. Remote alcohol use.  6. Chronic normocytic anemia.  7. Hyponitremia.   PLAN:  1. A 2D echocardiogram for reassessment of left ventricular function.  2. Increase lisinopril to 20 mg daily, for more aggressive blood      pressure control.  3. Followup BMET and BNP level in 1 week.  4. Schedule return clinic followup with myself and Dr. Andee Lineman in 6      months.      Gene Serpe, PA-C  Electronically Signed      Learta Codding, MD,FACC  Electronically Signed   GS/MedQ  DD: 07/05/2008  DT: 07/06/2008  Job #: [id number]   cc:   Demetrios Loll, PA

## 2010-10-07 NOTE — Assessment & Plan Note (Signed)
[facility]                          [facility]   NAME:Penny [name], Penny [name]                   MRN:          [id number]  DATE:05/24/2007                            DOB:          1956-03-18    HISTORY OF PRESENT ILLNESS:  The patient is a 55 year old African-  American female with a history of severe cardiomyopathy.  The patient  has a prior history of an ejection fraction of 20%.  The patient was  originally seen by me in April of 2008.  A Cardiolite study was ordered  at that time, which was felt to be abnormal and possibly consistent with  ischemic heart disease.  However, the patient did not want to proceed  with a catheterization at that point in time.  She was placed on medical  therapy and has seen Gene on several occasions now.  Medical therapy was  up-titrated and a followup echocardiogram was obtained.  This now  demonstrates an ejection fraction significantly improved to 35% to 40%.  Of note, also this patient has previously had nonsustained ventricular  tachycardia and when she was seen a year ago in [place],  recommendation was given to implant a defibrillator, which the patient  declined at the time.  Also, my initial impression in April 2008 was  that the patient likely has hypertensive heart disease with hypertensive  cardiomyopathy.  This was a postulated diagnosis prior to obtaining a  Cardiolite imaging study.  Of note also that, both on the Cardiolite and  the recent echocardiogram, the patient's ventricle was significantly  dilated.  The patient states that she is doing quite well now.  She has  no chest pain, shortness of breath, orthopnea, PND.  As a matter of  fact, she is an NYHA class I.  She also reports no palpitations.  On her  most recent EKG, no significant arrhythmias have been noted.   MEDICATIONS:  1. Spironolactone 25 mg p.o. daily.  2. Ferrous sulfate 325 daily.  3. Aspirin 81 mg a day.  4.  Lisinopril 10 mg p.o. daily.  5. Carvedilol 12.5 mg 1-1/2 tablet p.o. b.i.d.  6. Lasix 40 mg p.o. daily.   PHYSICAL EXAMINATION:  VITAL SIGNS:  Blood pressure 127/87, heart rate  is 83 beats per minute, weight 145 pounds.  NECK:  Normal carotid upstroke and no carotid bruits.  LUNGS:  Clear breath sounds bilaterally.  HEART:  Regular rate and rhythm.  Normal S1, S2.  No murmurs, rubs, or  gallops.  ABDOMEN:  Soft and nontender.  No rebound or guarding.  Good bowel  sounds.  EXTREMITIES:  No cyanosis, clubbing, or edema.  NEURO:  The patient is alert and oriented, grossly nonfocal.   PROBLEM LIST:  1. Cardiomyopathy.      a.     Rule out hypertensive heart disease.      b.     Rule out ischemic etiology with wall motion abnormalities by       echo.      c.     New [place] Heart Association class I.      d.  Ejection fraction improved to 35% to 40%.  2. History of nonsustained ventricular tachycardia.  3. Hypertension.  4. Chronic anemia.  5. History of remote alcohol use.  6. History of sudden cardiac death in family, with sudden death in      older family members.   PLAN:  1. The patient's lisinopril will be up-titrated to 20 mg p.o. daily.      Following this, further up-titration can be achieved off      carvedilol.  2. In reviewing all the data and noting that the patient has      persistent wall motion abnormalities by echo and an ejection      fraction that is not normalized, I have again addressed the issue      with the patient of cardiac catheterization.  She is now willing to      proceed.  The patient has consented to proceed with an outpatient      cardiac catheterization.  I have discussed risks and benefits with      the patient and she is willing to proceed.  3. If the patient has ischemic heart disease, further evaluation      regarding her nonsustained ventricular tachycardia is indicated.      If the patient has a nonischemic cardiomyopathy, I do think  further      up-titration of medications is warranted without necessitating      defibrillator implant.     Learta Codding, MD,FACC  Electronically Signed    GED/MedQ  DD: 05/24/2007  DT: 05/24/2007  Job #: [id number]

## 2010-10-10 NOTE — Letter (Signed)
September 04, 2008     RE:  Penny [name], Penny [name]  MRN:  [id number]  /  DOB:  [birth year]   To Whom It May Concern:   Penny [name] is a patient of mine with severe nonischemic cardiomyopathy.  Catheterization in 2009 indicated normal coronary arteries but with  significant LV dysfunction.  Her ejection fraction, although slightly  improved, remains subnormal.  Given the longstanding history of her  cardiomyopathy, it is unlikely that her LV function will significantly  improve over the next couple of years.  Fortunately, however, the  patient is in NYHA Class I and is responding well to her medical  therapy.  She, however, has a significant disability due to her  underlying severe nonischemic cardiomyopathy.   If you have any further questions regarding this patient's medical  condition, please do not hesitate to call me at my office at [phone].    Sincerely,      Learta Codding, MD,FACC  Electronically Signed    GED/MedQ  DD: 09/04/2008  DT: 09/04/2008  Job #: [id number]

## 2010-10-10 NOTE — Assessment & Plan Note (Signed)
[facility]                          [facility]   NAME:[name], [name]                   MRN:          [id number]  DATE:09/08/2006                            DOB:          27-Oct-1955    HISTORY OF PRESENT ILLNESS:  The patient is a 55 year old African  American female who was seen in the emergency room in February 2008 with  what appeared to be congestive heart failure. The reports to me that she  had fluid build up in the lungs. An EKG was done as well as an  echocardiogram. She states the results were good. I assumed that the  patient ruled out for myocardial infarction as no stress testing was  preformed. The patient is here now mainly for a second opinion.   The patient states that she has had for many years high blood pressure  which was probably uncontrolled. She also has a history of fluid build  up over the last several months, when she was placed on diuretics  during this last hospitalization, however she has felt much better. She  is currently NYHA class 1. She has no orthopnea or PND. She reports no  substernal chest pain on exertion. She has no palpitations or syncope.  In the office her EKG does show significant left ventricular hypertrophy  with secondary repolarization changes.   PAST MEDICAL HISTORY:  1. History of hypertension.  2. History of congestive heart failure.  3. History of cholecystectomy.   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 She  does not smoke or drink.   FAMILY HISTORY:  Notable for significant coronary artery disease at  premature age on her fathers side of the family.   MEDICATIONS:  1. Spironolactone 25 mg p.o. daily.  2. Ferrous sulfate 325 mg p.o. daily.  3. Lasix 40 mg p.o. b.i.d.  4. Carvedilol 12.5 mg p.o. b.i.d.  5. Aspirin 81 mg p.o. day.   REVIEW OF SYSTEMS:  As per HPI. The patient denies any nausea or  vomiting. No fever or chills. No melena or  hematochezia. No dysuria, or  frequency. No polyuria or polydipsia. No weakness or fatigue. No  arthritic complaints. No syncope. No reflux symptoms. The remainder of  the review of systems is negative.   PHYSICAL EXAMINATION:  Blood pressure 135/91, heart 81 beats per minute.  GENERAL: Well-nourished African American female in no apparent distress.  HEENT: Normal.  NECK EXAM: Normal carotid upstrokes. No carotid bruits. No thyromegaly.  JVD is 8 cm.  LUNGS: Clear breath sounds bilaterally. No wheezing.  HEART: Regular rate and rhythm with normal S1, S2. No murmur, rubs, or  gallops. The PMI is nondisplaced.  ABDOMEN: Soft, nontender. No rebound or guarding. Good bowel sounds.  EXTREMITY EXAM: Reveals no cyanosis, clubbing, or edema.  NEURO: Patient is alert, oriented, and grossly nonfocal.   A 12-lead electrocardiogram as outlined above.   PROBLEMS:  1. Hypertensive heart disease.  2. Status post recent congestive heart failure ( acute diastolic heart      failure).  3. Result of echo and EKG from [facility]  pending.  4. Ruled out for myocardial infarction.   PLAN:  1. The patient's blood pressure is still somewhat poorly controlled.      We will add lisinopril 10 mg p.o. daily to her medical regimen and      BMET will be drawn in 1 week to make sure the patient does not      develop hyperkalemia in conjunction with spironolactone.  2. The patient will follow up in 1 week with an exercise Cardiolite      study to make sure that she has no underlying ischemic heart      disease.  3. If the patient's perfusion study is positive she may require      cardiac catheterization but I do think that this is most likely      represents hypertensive disease with diastolic dysfunction.  4. Aggressive risk factor modification is indicated and I have made      otherwise no changes in her medical regimen.     Learta Codding, MD,FACC  Electronically Signed    GED/MedQ  DD: 09/08/2006   DT: 09/08/2006  Job #: [id number]

## 2010-10-10 NOTE — Assessment & Plan Note (Signed)
[facility]                          [facility]   NAME:Penny [name], Penny [name]                   MRN:          [id number]  DATE:09/21/2006                            DOB:          [birth year]    PRIMARY CARDIOLOGIST:  Dr. Lewayne Bunting.   REASON FOR VISIT:  Scheduled clinic followup.   The patient now returns to the clinic following recent exercise stress  Cardiolite per Dr. Lewayne Bunting for further evaluation of severe  cardiomyopathy.  The patient presents with no prior history of  documented coronary artery disease, and was recently hospitalized in  [place] in February of this year with congestive heart failure.  A  2D echo at that time revealed severe LV dysfunction (EF 15%) with global  hypokinesis and evidence of pulmonary hypertension, and mild mitral  regurgitation.  She was also noted to have documented non-sustained  ventricular tachycardia and was advised to undergo placement of a  defibrillator, but the patient declined.  There was no indication of any  plans for a cardiac catheterization.   The recent stress test, reviewed by Dr. Andee Lineman, was clinically and  electrocardiographically negative for ischemia, but perfusion data was  suggestive of a small/moderate anterior apical defect, which appeared to  be reversible; calculated ejection fraction at 19%.   Of note, the patient also had a 2D echo in June of 2005, reviewed by Dr.  Madolyn Frieze. [place], suggesting an ejection fraction of 10-20%.  She was  seen in consultation at that time by Dr. Rollene Rotunda for evaluation  of cardiomyopathy and prior history of congestive heart failure in 1998.  Of note, however, she was felt to be essentially asymptomatic by Dr.  Antoine Poche with perhaps, at most, NYHA class II classification at that  time.  She was also noted to have hypertension, and was started on an  ACE inhibitor at that time.   The patient tells me today, however, that she  was not on any medications  prior to her recent hospitalization in [place]; since then she  states she has done extremely well with no recurrent symptoms of  dyspnea.  She also denies any chest pain, tachy palpitations, orthopnea,  paroxysmal nocturnal dyspnea, or lower extremity edema.   CURRENT MEDICATIONS:  1. Coreg 12.5 b.i.d.  2. Low-dose aspirin.  3. Lasix 40 b.i.d.  4. Lisinopril 10 daily.  5. Spironolactone 25 daily.  6. Ferrous sulfate 325 daily.   Followup BMET on April 24 notable for BUN 15, creatinine 0.5, sodium  133, and potassium 4.4.   PHYSICAL EXAMINATION:  Blood pressure 144/91, pulse 92 and regular,  weight 146.  GENERAL:  A 55 year old female sitting upright in no distress.  HEENT:  Normocephalic, atraumatic.  NECK:  Palpable bilateral carotid pulses without bruits; no JVD.  LUNGS:  Clear to auscultation in all fields.  HEART:  Regular rate and rhythm (S1, S2).  No significant murmurs.  ABDOMEN:  Benign.  EXTREMITIES:  Trace pedal edema.  NEURO:  No focal deficits.   IMPRESSION:  1. Severe cardiomyopathy.      a.     Worrisome for  ischemic etiology per a recent stress       perfusion imaging study.      b.     Ejection fraction approximately 20%.      c.     Currently New [place] Heart Association class I.  2. History of nonsustained ventricular tachycardia.  3. Hypertension.  4. Chronic anemia.  5. History of alcohol use.   PLAN:  1. At this point, the patient is disinclined to proceed with      recommended cardiac catheterization to exclude ischemic etiology.      She has, however, agreed to return for clinic follow up and undergo      a repeat 2D echo in approximately 6 months.  If the repeat study      does not show improvement in her left ventricular function, then      she has agreed to undergo diagnostic coronary angiography.  2. Up-titrate Coreg to 18.75 b.i.d. with a target goal of 25 b.i.d.      for aggressive management of  cardiomyopathy.  3. Return clinic follow up in 2 weeks for a blood pressure and pulse      check.  4. Schedule return clinic followup with myself and Dr. Andee Lineman in 6      months.      Gene Serpe, PA-C       Learta Codding, MD,FACC    GS/MedQ  DD: 09/21/2006  DT: 09/21/2006  Job #: [id number]   cc:   of [facility]

## 2010-11-21 ENCOUNTER — Other Ambulatory Visit: Payer: Self-pay | Admitting: *Deleted

## 2010-11-21 MED ORDER — FERROUS SULFATE 325 (65 FE) MG PO TABS: 325.0000 mg | ORAL_TABLET | Freq: Every day | ORAL | Status: AC

## 2010-11-21 MED ORDER — OMEPRAZOLE 20 MG PO CPDR: 20.0000 mg | DELAYED_RELEASE_CAPSULE | Freq: Every day | ORAL | Status: DC

## 2011-02-12 LAB — POCT I-STAT 3, VENOUS BLOOD GAS (G3P V)
Acid-base deficit: 1
Bicarbonate: 23.8
O2 Saturation: 62
Operator id: 221371
TCO2: 25
pCO2, Ven: 39.7 — ABNORMAL LOW
pH, Ven: 7.386 — ABNORMAL HIGH
pO2, Ven: 33

## 2011-02-12 LAB — POCT I-STAT 3, ART BLOOD GAS (G3+)
Acid-base deficit: 2
Bicarbonate: 21.9
O2 Saturation: 96
Operator id: 221371
TCO2: 23
pCO2 arterial: 34.1 — ABNORMAL LOW
pH, Arterial: 7.416 — ABNORMAL HIGH
pO2, Arterial: 79 — ABNORMAL LOW

## 2011-03-13 ENCOUNTER — Other Ambulatory Visit: Payer: Self-pay | Admitting: *Deleted

## 2011-03-13 MED ORDER — CARVEDILOL 25 MG PO TABS: 25.0000 mg | ORAL_TABLET | Freq: Two times a day (BID) | ORAL | Status: DC

## 2011-06-07 ENCOUNTER — Other Ambulatory Visit: Payer: Self-pay | Admitting: Cardiology

## 2011-07-29 ENCOUNTER — Encounter: Payer: Self-pay | Admitting: *Deleted

## 2011-07-30 ENCOUNTER — Encounter: Payer: Self-pay | Admitting: Cardiology

## 2011-07-30 ENCOUNTER — Ambulatory Visit (INDEPENDENT_AMBULATORY_CARE_PROVIDER_SITE_OTHER): Payer: Medicare Other | Admitting: Cardiology

## 2011-07-30 DIAGNOSIS — I428 Other cardiomyopathies: Secondary | ICD-10-CM

## 2011-07-30 NOTE — Patient Instructions (Signed)
Continue all current medications. Your physician wants you to follow up in: 6 months.  You will receive a reminder letter in the mail one-two months in advance.  If you don't receive a letter, please call our office to schedule the follow up appointment   

## 2011-07-30 NOTE — Progress Notes (Signed)
Peyton Bottoms, MD, [facility] ABIM Board Certified in Adult Cardiovascular Medicine,Internal Medicine and Critical Care Medicine    CC: Followup patient nonischemic cardiomyopathy  HPI: cardiomyopathy ejection fraction 20%. The patient has declined implantation of an ICD. She's compliant with her medical regimen and she is on an excellent heart failure regimen. She reports no heart failure symptoms has not required any hospitalizations. She has no chest pain orthopnea or PND. She reports no presyncope or syncope. She exercises on a daily basis and is in NYHA class I. Laboratory work is performed by the ID clinic. The patient is a 56 year old female HIV positive with a nonischemic   PMH: reviewed and listed in Problem List in Electronic Records (and see below) Past Medical History  Diagnosis Date  . Nonischemic cardiomyopathy     A.  normal coronary arteries, January 2009 B.  ejection fraction 20%; global hypokinesis  . Chronic systolic heart failure   . Essential hypertension, benign   . Nonsustained ventricular tachycardia     declined ICD therapy  . Chronic anemia   . History of alcohol abuse   . Allergic sinusitis   . HIV (human immunodeficiency virus infection)     undetectable viral load 2011/followed at [facility]   Past Surgical History  Procedure Date  . Cholecystectomy   . Tubal ligation     Allergies/SH/FHX : available in Electronic Records for review  Allergies  Allergen Reactions  . Levofloxacin     REACTION: hives   History   Social History  . Marital Status: Married    Spouse Name: N/A    Number of Children: N/A  . Years of Education: N/A   Occupational History  . Not on file.   Social History Main Topics  . Smoking status: Never Smoker   . Smokeless tobacco: Never Used  . Alcohol Use: Not on file  . Drug Use: Not on file  . Sexually Active: Not on file   Other Topics Concern  . Not on file   Social History Narrative   Lives in [place]  with daughter. Works as a Comptroller.   Family History  Problem Relation Age of Onset  . Heart failure      Medications: Current Outpatient Prescriptions  Medication Sig Dispense Refill  . carvedilol (COREG) 25 MG tablet Take 1 tablet (25 mg total) by mouth 2 (two) times daily.  60 tablet  0  . Chlorpheniramine-DM (CORICIDIN COUGH/COLD) 4-30 MG TABS Take 1 tablet by mouth as needed.      . darunavir (PREZISTA) 600 MG tablet Take 600 mg by mouth 2 (two) times daily with a meal.      . ferrous sulfate 325 (65 FE) MG tablet Take 1 tablet (325 mg total) by mouth daily with breakfast.  30 tablet  6  . fish oil-omega-3 fatty acids 1000 MG capsule Take 1 g by mouth daily.      . furosemide (LASIX) 40 MG tablet Take 40 mg by mouth daily.      Marland Kitchen lamiVUDine-zidovudine (COMBIVIR) 150-300 MG per tablet Take 1 tablet by mouth 2 (two) times daily.      Marland Kitchen lisinopril (PRINIVIL,ZESTRIL) 20 MG tablet Take 20 mg by mouth daily.      Marland Kitchen loratadine (CLARITIN) 10 MG tablet Take 10 mg by mouth daily.      Marland Kitchen nystatin (MYCOSTATIN) 100000 UNIT/ML suspension Take 500,000 Units by mouth as directed.      Marland Kitchen omeprazole (PRILOSEC) 20 MG capsule TAKE ONE  CAPSULE BY MOUTH EVERY DAY  30 capsule  0  . raltegravir (ISENTRESS) 400 MG tablet Take 400 mg by mouth 2 (two) times daily.      . ritonavir (NORVIR) 100 MG TABS Take 100 mg by mouth 2 (two) times daily with a meal.      . spironolactone (ALDACTONE) 25 MG tablet Take 25 mg by mouth daily.        ROS: No nausea or vomiting. No fever or chills.No melena or hematochezia.No bleeding.No claudication  Physical Exam: BP 146/90  Pulse 75  Ht 5\' 1"  (1.549 m)  Wt 168 lb (76.204 kg)  BMI 31.74 kg/m2 General: Well-nourished.corrected female in no distress Neck: Normal carotid upstroke no carotid bruits. Lungs: Clear breath sounds bilaterally. No wheezing Cardiac: Regular rate and rhythm with normal S1-S2 no murmur rubs or gallops Vascular: No edema. Normal distal  pulses Skin: Warm and dry Physcologic: Normal affect  12lead ECG: Normal sinus rhythm nonspecific ST-T wave changes Limited bedside ECHO:N/A No images are attached to the encounter.    Patient Active Problem List  Diagnoses  . HIV TEST POSITIVE  . HYPERTENSION, BENIGN  . CARDIOMYOPATHY, DILATED-declined ICD implantation   . CHF  . LEFT VENTRICULAR FUNCTION, DECREASED best ejection fraction 20%   . VENTRICULAR HYPERTROPHY, LEFT    PLAN   Blood pressure elevated this morning but the patient did not take her medications yet.  There have been no heart failure admissions.  She is in NYHA class I and remains physically very active. No indication at the present time to repeat an echocardiogram.  The patient continues to decline implantation of ICD for primary prevention of sudden cardiac death.

## 2011-08-06 ENCOUNTER — Other Ambulatory Visit: Payer: Self-pay | Admitting: Cardiology

## 2011-08-07 ENCOUNTER — Ambulatory Visit: Payer: Self-pay | Admitting: Cardiology

## 2011-11-11 ENCOUNTER — Telehealth: Payer: Self-pay | Admitting: Cardiology

## 2011-11-11 NOTE — Telephone Encounter (Signed)
Left message for pharmacy to send refill request for ferrous sulfate to patients PCP stated that if they had any questions to give a call back to [phone]. Burnadette Pop, CMA

## 2013-02-14 ENCOUNTER — Ambulatory Visit: Payer: Medicare Other | Admitting: Cardiology

## 2013-02-16 ENCOUNTER — Ambulatory Visit: Payer: Medicare Other | Admitting: Cardiology

## 2013-03-07 ENCOUNTER — Ambulatory Visit: Payer: Medicare Other | Admitting: Cardiology

## 2013-05-04 ENCOUNTER — Ambulatory Visit: Payer: Medicare Other | Admitting: Cardiology

## 2013-06-07 ENCOUNTER — Ambulatory Visit: Payer: Medicare Other | Admitting: Cardiology

## 2013-06-27 ENCOUNTER — Ambulatory Visit (INDEPENDENT_AMBULATORY_CARE_PROVIDER_SITE_OTHER): Payer: Medicare Other | Admitting: Cardiology

## 2013-06-27 ENCOUNTER — Encounter: Payer: Self-pay | Admitting: Cardiology

## 2013-06-27 VITALS — BP 128/80 | HR 72 | Ht 61.0 in | Wt 158.0 lb

## 2013-06-27 DIAGNOSIS — I428 Other cardiomyopathies: Secondary | ICD-10-CM

## 2013-06-27 NOTE — Patient Instructions (Signed)
Your physician recommends that you schedule a follow-up appointment in: 3 months with Dr. Branch. This appointment will be scheduled today before you leave.   Your physician recommends that you continue on your current medications as directed. Please refer to the Current Medication list given to you today.   

## 2013-06-27 NOTE — Progress Notes (Addendum)
Clinical Summary Penny [name] is a 58 y.o.female former patient of Dr Andee Lineman, this is our first visit together. She is see for the following medical problems.  1. NICM - LVEF 20%, cath 2009 with patent coronaries per notes. - has refused ICD previously - no recent echo in our system, she thinks she may have had one at [facility] 2 years ago with SOB.  - compliant with meds.  - denies any SOB or DOE. Denies any exertional symptoms, walks up flights of stairs regularly without troubles. - limiting sodium intake, takes aleve 2 in morning and 2 and night.   Past Medical History  Diagnosis Date  . Nonischemic cardiomyopathy     A.  normal coronary arteries, January 2009 B.  ejection fraction 20%; global hypokinesis  . Chronic systolic heart failure   . Essential hypertension, benign   . Nonsustained ventricular tachycardia     declined ICD therapy  . Chronic anemia   . History of alcohol abuse   . Allergic sinusitis   . HIV (human immunodeficiency virus infection)     undetectable viral load 2011/followed at [facility]     Allergies  Allergen Reactions  . Levofloxacin     REACTION: hives     Current Outpatient Prescriptions  Medication Sig Dispense Refill  . carvedilol (COREG) 25 MG tablet Take 1 tablet (25 mg total) by mouth 2 (two) times daily.  60 tablet  0  . Chlorpheniramine-DM (CORICIDIN COUGH/COLD) 4-30 MG TABS Take 1 tablet by mouth as needed.      . darunavir (PREZISTA) 600 MG tablet Take 600 mg by mouth 2 (two) times daily with a meal.      . ferrous sulfate 325 (65 FE) MG tablet Take 1 tablet (325 mg total) by mouth daily with breakfast.  30 tablet  6  . fish oil-omega-3 fatty acids 1000 MG capsule Take 1 g by mouth daily.      . furosemide (LASIX) 40 MG tablet Take 40 mg by mouth daily.      Marland Kitchen lamiVUDine-zidovudine (COMBIVIR) 150-300 MG per tablet Take 1 tablet by mouth 2 (two) times daily.      Marland Kitchen lisinopril (PRINIVIL,ZESTRIL) 20 MG tablet  Take 20 mg by mouth daily.      Marland Kitchen loratadine (CLARITIN) 10 MG tablet Take 10 mg by mouth daily.      Marland Kitchen nystatin (MYCOSTATIN) 100000 UNIT/ML suspension Take 500,000 Units by mouth as directed.      Marland Kitchen omeprazole (PRILOSEC) 20 MG capsule TAKE ONE CAPSULE BY MOUTH EVERY DAY  30 capsule  0  . omeprazole (PRILOSEC) 20 MG capsule TAKE ONE CAPSULE BY MOUTH EVERY DAY  30 capsule  6  . raltegravir (ISENTRESS) 400 MG tablet Take 400 mg by mouth 2 (two) times daily.      . ritonavir (NORVIR) 100 MG TABS Take 100 mg by mouth 2 (two) times daily with a meal.      . spironolactone (ALDACTONE) 25 MG tablet Take 25 mg by mouth daily.       No current facility-administered medications for this visit.     Past Surgical History  Procedure Laterality Date  . Cholecystectomy    . Tubal ligation       Allergies  Allergen Reactions  . Levofloxacin     REACTION: hives      Family History  Problem Relation Age of Onset  . Heart failure       Social History Penny [name]  reports that she has never smoked. She has never used smokeless tobacco. Penny [name] has no alcohol history on file.   Review of Systems CONSTITUTIONAL: No weight loss, fever, chills, weakness or fatigue.  HEENT: Eyes: No visual loss, blurred vision, double vision or yellow sclerae.No hearing loss, sneezing, congestion, runny nose or sore throat.  SKIN: No rash or itching.  CARDIOVASCULAR: per HPI RESPIRATORY: No shortness of breath, cough or sputum.  GASTROINTESTINAL: No anorexia, nausea, vomiting or diarrhea. No abdominal pain or blood.  GENITOURINARY: No burning on urination, no polyuria NEUROLOGICAL: No headache, dizziness, syncope, paralysis, ataxia, numbness or tingling in the extremities. No change in bowel or bladder control.  MUSCULOSKELETAL: No muscle, back pain, joint pain or stiffness.  LYMPHATICS: No enlarged nodes. No history of splenectomy.  PSYCHIATRIC: No history of depression or anxiety.  ENDOCRINOLOGIC: No  reports of sweating, cold or heat intolerance. No polyuria or polydipsia.  Marland Kitchen.   Physical Examination p 72 bp 128/80 Wt 158 lbs BMI 30 Gen: resting comfortably, no acute distress HEENT: no scleral icterus, pupils equal round and reactive, no palptable cervical adenopathy,  CV: RRR, no m/r/g, no JVD, no carotid bruit Resp: Clear to auscultation bilaterally GI: abdomen is soft, non-tender, non-distended, normal bowel sounds, no hepatosplenomegaly MSK: extremities are warm, no edema.  Skin: warm, no rash Neuro:  no focal deficits Psych: appropriate affect   06/27/13 Clinic EKG Sinus rhythm  Assessment and Plan  1. NICM - no recent echo in our system, notes refer to most recent LVEF 20%. Patient reports she may have had a more recent echo done in [place], we will request records. She states she was told that everything was fine and she no longer needed an ICD, but I am not sure about the basis of this conversation as there is no documentation of a normalized LVEF. If no recent echo in [place], will repeat at next visit. - NYHA I, she is euvolemic today, we will continue current medications - will request labs from her PCP    Follow up 3 months  Antoine PocheJonathan F. Kacey Dysert, M.D., F.A.C.C.

## 2013-06-28 ENCOUNTER — Telehealth: Payer: Self-pay | Admitting: Cardiology

## 2013-06-28 DIAGNOSIS — I509 Heart failure, unspecified: Secondary | ICD-10-CM

## 2013-06-28 NOTE — Telephone Encounter (Signed)
Pt made aware and echocardiogram ordered and scheduled.

## 2013-06-28 NOTE — Telephone Encounter (Signed)
Message copied by Burnice Logan on Wed Jun 28, 2013 11:15 AM ------      Message from: Dina Rich F      Created: Tue Jun 27, 2013  9:55 AM       Please let patient know that we got the records from [place] and it does not appear that they did an echo. Please order an echo for her without contrast for CHF.            Dina Rich MD ------

## 2013-07-06 ENCOUNTER — Other Ambulatory Visit: Payer: Medicare Other

## 2013-07-12 ENCOUNTER — Telehealth: Payer: Self-pay | Admitting: Cardiology

## 2013-07-12 NOTE — Telephone Encounter (Signed)
Called Penny [name] in regards to re-scheduling her echo ordered by Dr.Branch. Left a message.

## 2013-08-02 ENCOUNTER — Other Ambulatory Visit (INDEPENDENT_AMBULATORY_CARE_PROVIDER_SITE_OTHER): Payer: Medicare Other

## 2013-08-02 DIAGNOSIS — I509 Heart failure, unspecified: Secondary | ICD-10-CM

## 2013-08-02 DIAGNOSIS — I428 Other cardiomyopathies: Secondary | ICD-10-CM

## 2013-08-08 ENCOUNTER — Telehealth: Payer: Self-pay | Admitting: Cardiology

## 2013-08-08 NOTE — Telephone Encounter (Signed)
Message copied by Burnice Logan on Tue Aug 08, 2013  2:21 PM ------      Message from: [place] F      Created: Mon Aug 07, 2013 10:12 PM       Please let patient know that echo shows her heart function still is not quite normal but is improved from prior studies.            Dina Rich MD ------

## 2013-08-08 NOTE — Telephone Encounter (Signed)
Pt informed of results and verbalized understanding.  

## 2013-10-13 ENCOUNTER — Ambulatory Visit: Payer: Medicare Other | Admitting: Cardiology

## 2013-10-31 ENCOUNTER — Encounter: Payer: Self-pay | Admitting: Cardiology

## 2013-10-31 ENCOUNTER — Ambulatory Visit (INDEPENDENT_AMBULATORY_CARE_PROVIDER_SITE_OTHER): Payer: Medicare Other | Admitting: Cardiology

## 2013-10-31 VITALS — BP 130/87 | HR 76 | Ht 61.0 in | Wt 158.0 lb

## 2013-10-31 DIAGNOSIS — I10 Essential (primary) hypertension: Secondary | ICD-10-CM

## 2013-10-31 DIAGNOSIS — I5022 Chronic systolic (congestive) heart failure: Secondary | ICD-10-CM

## 2013-10-31 DIAGNOSIS — E785 Hyperlipidemia, unspecified: Secondary | ICD-10-CM

## 2013-10-31 MED ORDER — SPIRONOLACTONE 25 MG PO TABS
25.0000 mg | ORAL_TABLET | Freq: Every day | ORAL | Status: AC
Start: 2013-10-31 — End: ?

## 2013-10-31 MED ORDER — CARVEDILOL 25 MG PO TABS: 25.0000 mg | ORAL_TABLET | Freq: Two times a day (BID) | ORAL | Status: AC

## 2013-10-31 NOTE — Patient Instructions (Signed)
Continue all current medications. Patient to call office with name of statin  Your physician wants you to follow up in: 6 months.  You will receive a reminder letter in the mail one-two months in advance.  If you don't receive a letter, please call our office to schedule the follow up appointment

## 2013-10-31 NOTE — Progress Notes (Signed)
Clinical Summary [name] is a 58 y.o.female seen today for follow up of the following medical problems.   1. NICM  - history of LVEF 20%, cath 2009 with patent coronaries per notes, had refused ICD previously  - repeat echo 07/2013 with LVEF 40-45%, grade I diastolic dysfunction.  - denies any SOB or DOE. Denies any exertional symptoms, walks up flights of stairs regularly without troubles.  - limiting sodium intake, compliant with meds  2. HTN - compliant with meds  3. Hyperlipidemia - reports started on statin by pcp, caused muscle aches. She is not sure which medication was started.    Past Medical History  Diagnosis Date  . Nonischemic cardiomyopathy     A.  normal coronary arteries, January 2009 B.  ejection fraction 20%; global hypokinesis  . Chronic systolic heart failure   . Essential hypertension, benign   . Nonsustained ventricular tachycardia     declined ICD therapy  . Chronic anemia   . History of alcohol abuse   . Allergic sinusitis   . HIV (human immunodeficiency virus infection)     undetectable viral load 2011/[facility]     Allergies  Allergen Reactions  . Levofloxacin     REACTION: hives     Current Outpatient Prescriptions  Medication Sig Dispense Refill  . carvedilol (COREG) 25 MG tablet Take 25 mg by mouth 2 (two) times daily.      . Chlorpheniramine-DM (CORICIDIN COUGH/COLD) 4-30 MG TABS Take 1 tablet by mouth as needed.      . darunavir (PREZISTA) 600 MG tablet Take 600 mg by mouth 2 (two) times daily with a meal.      . ferrous sulfate 325 (65 FE) MG tablet Take 1 tablet (325 mg total) by mouth daily with breakfast.  30 tablet  6  . fish oil-omega-3 fatty acids 1000 MG capsule Take 1 g by mouth daily.      . furosemide (LASIX) 40 MG tablet Take 40 mg by mouth daily.      Marland Kitchen. lamiVUDine-zidovudine (COMBIVIR) 150-300 MG per tablet Take 1 tablet by mouth 2 (two) times daily.      Marland Kitchen. lisinopril (PRINIVIL,ZESTRIL) 20 MG  tablet Take 20 mg by mouth daily.      Marland Kitchen. loratadine (CLARITIN) 10 MG tablet Take 10 mg by mouth daily.      Marland Kitchen. nystatin (MYCOSTATIN) 100000 UNIT/ML suspension Take 500,000 Units by mouth as directed.      Marland Kitchen. omeprazole (PRILOSEC) 20 MG capsule TAKE ONE CAPSULE BY MOUTH EVERY DAY  30 capsule  0  . raltegravir (ISENTRESS) 400 MG tablet Take 400 mg by mouth 2 (two) times daily.      . ritonavir (NORVIR) 100 MG TABS Take 100 mg by mouth 2 (two) times daily with a meal.      . spironolactone (ALDACTONE) 25 MG tablet Take 25 mg by mouth daily.       No current facility-administered medications for this visit.     Past Surgical History  Procedure Laterality Date  . Cholecystectomy    . Tubal ligation       Allergies  Allergen Reactions  . Levofloxacin     REACTION: hives      Family History  Problem Relation Age of Onset  . Heart failure       Social History [name] reports that she has never smoked. She has never used smokeless tobacco. [name] has no alcohol history on file.  Review of Systems CONSTITUTIONAL: No weight loss, fever, chills, weakness or fatigue.  HEENT: Eyes: No visual loss, blurred vision, double vision or yellow sclerae.No hearing loss, sneezing, congestion, runny nose or sore throat.  SKIN: No rash or itching.  CARDIOVASCULAR: per HPI RESPIRATORY: No shortness of breath, cough or sputum.  GASTROINTESTINAL: No anorexia, nausea, vomiting or diarrhea. No abdominal pain or blood.  GENITOURINARY: No burning on urination, no polyuria NEUROLOGICAL: No headache, dizziness, syncope, paralysis, ataxia, numbness or tingling in the extremities. No change in bowel or bladder control.  MUSCULOSKELETAL: No muscle, back pain, joint pain or stiffness.  LYMPHATICS: No enlarged nodes. No history of splenectomy.  PSYCHIATRIC: No history of depression or anxiety.  ENDOCRINOLOGIC: No reports of sweating, cold or heat intolerance. No polyuria or polydipsia.   Marland Kitchen   Physical Examination p 76 bp 130/87 Wt 158 lbs BMI 30 Gen: resting comfortably, no acute distress HEENT: no scleral icterus, pupils equal round and reactive, no palptable cervical adenopathy,  CV: RRR, no m/r/g, no JVD, no carotid bruits Resp: Clear to auscultation bilaterally GI: abdomen is soft, non-tender, non-distended, normal bowel sounds, no hepatosplenomegaly MSK: extremities are warm, no edema.  Skin: warm, no rash Neuro:  no focal deficits Psych: appropriate affect   Diagnostic Studies 08/02/13 Echo Study Conclusions  - Left ventricle: Mild hypokinesis of the anterior septum. Moderate hypokinesis base inferior-septal segment. Inferior wall difficult to fully assess. Images suggest significant hypokinesis. The EF is in the 40-45% raqnge. The cavity size was mildly dilated. Wall thickness was increased in a pattern of mild LVH. Doppler parameters are consistent with abnormal left ventricular relaxation (grade 1 diastolic dysfunction). - Mitral valve: Trivial regurgitation. - Left atrium: The atrium was mildly dilated. - Right ventricle: The cavity size was normal. Systolic function was normal.      Assessment and Plan  1. NICM  - LVEF has improved to 40-45%, she is NYHA I. She is euvolemic today.  - continue current medications  2. HTN - at goal, continue current meds  3. Hyperlipidemia - started on medicine by her pcp recently, she is not sure which one, and stopped on her own due to muscle aches - she is to give [place] a call tomorrow with the medication name, consider alternative potentially more mild statin. Will also request most recent labs from pcp   F/u 6 months    Antoine Poche, M.D., F.A.C.C.

## 2013-11-21 ENCOUNTER — Telehealth: Payer: Self-pay | Admitting: *Deleted

## 2013-11-21 NOTE — Telephone Encounter (Signed)
Patient was told to call back with name of her cholesterol medication that she was on.  Currently on Pravastatin 40mg  daily.  Most recent labs were requested from PMD & scanned in for your review.

## 2013-11-21 NOTE — Telephone Encounter (Signed)
Pravastatin is one of the more mild statins, if she had side effects to that one she likely will to other. I would try lovastatin 20mg  daily to see if she is able to tolerate   Dominga Ferry MD

## 2013-11-22 MED ORDER — LOVASTATIN 20 MG PO TABS
20.0000 mg | ORAL_TABLET | Freq: Every day | ORAL | Status: AC
Start: 2013-11-22 — End: ?

## 2013-11-22 NOTE — Telephone Encounter (Signed)
Patient notified.  She is willing to try the Lovastatin.  New rx sent to pharm.

## 2014-05-10 ENCOUNTER — Ambulatory Visit (INDEPENDENT_AMBULATORY_CARE_PROVIDER_SITE_OTHER): Payer: Medicare Other | Admitting: Cardiology

## 2014-05-10 ENCOUNTER — Encounter: Payer: Self-pay | Admitting: Cardiology

## 2014-05-10 VITALS — BP 139/83 | HR 82 | Ht 61.0 in | Wt 146.0 lb

## 2014-05-10 DIAGNOSIS — Z01810 Encounter for preprocedural cardiovascular examination: Secondary | ICD-10-CM

## 2014-05-10 DIAGNOSIS — E785 Hyperlipidemia, unspecified: Secondary | ICD-10-CM

## 2014-05-10 DIAGNOSIS — I10 Essential (primary) hypertension: Secondary | ICD-10-CM

## 2014-05-10 DIAGNOSIS — I5022 Chronic systolic (congestive) heart failure: Secondary | ICD-10-CM

## 2014-05-10 NOTE — Progress Notes (Signed)
Clinical Summary [name] is a 58 y.o.female seen today for follow up of the following medical problems.   1. NICM  - history of LVEF 20%, cath 2009 with patent coronaries per notes - repeat echo 07/2013 with LVEF 40-45%, grade I diastolic dysfunction.  - denies any SOB or DOE. Denies any exertional symptoms, walks up flights of stairs regularly without troubles.  - limiting sodium intake, compliant with meds - denies any LE edema.  - she reports she has not taken lisinopril for quite some time for unclear reasons   2. HTN - compliant with meds  3. Hyperlipidemia - tolerating lovastatin, previous muscle aches on other statins including pravastatin.   4. Preoperative evaluation - planned for hip replacement - no chest pain, no SOB, no DOE - walks up a flight of stairs regularly without limitations.    Past Medical History  Diagnosis Date  . Nonischemic cardiomyopathy     A.  normal coronary arteries, January 2009 B.  ejection fraction 20%; global hypokinesis  . Chronic systolic heart failure   . Essential hypertension, benign   . Nonsustained ventricular tachycardia     declined ICD therapy  . Chronic anemia   . History of alcohol abuse   . Allergic sinusitis   . HIV (human immunodeficiency virus infection)     undetectable viral load 2011/[facility]     Allergies  Allergen Reactions  . Levofloxacin     REACTION: hives     Current Outpatient Prescriptions  Medication Sig Dispense Refill  . carvedilol (COREG) 25 MG tablet Take 1 tablet (25 mg total) by mouth 2 (two) times daily. 180 tablet 3  . Cholecalciferol (VITAMIN D-3) 1000 UNITS CAPS Take 1 capsule by mouth daily.    . darunavir (PREZISTA) 600 MG tablet Take 600 mg by mouth 2 (two) times daily with a meal.    . diclofenac (VOLTAREN) 75 MG EC tablet Take 75 mg by mouth 2 (two) times daily.    . ferrous sulfate 325 (65 FE) MG tablet Take 1 tablet (325 mg total) by mouth daily with  breakfast. 30 tablet 6  . fish oil-omega-3 fatty acids 1000 MG capsule Take 1 g by mouth daily.    . furosemide (LASIX) 40 MG tablet Take 40 mg by mouth daily.    Marland Kitchen. gabapentin (NEURONTIN) 300 MG capsule Take 300 mg by mouth 3 (three) times daily.    Marland Kitchen. ibuprofen (ADVIL,MOTRIN) 400 MG tablet Take 200-400 mg by mouth 2 (two) times daily as needed.    . lamiVUDine-zidovudine (COMBIVIR) 150-300 MG per tablet Take 1 tablet by mouth 2 (two) times daily.    Marland Kitchen. lisinopril (PRINIVIL,ZESTRIL) 20 MG tablet Take 20 mg by mouth daily.    Marland Kitchen. loratadine (CLARITIN) 10 MG tablet Take 10 mg by mouth daily.    Marland Kitchen. lovastatin (MEVACOR) 20 MG tablet Take 1 tablet (20 mg total) by mouth daily. 30 tablet 6  . raltegravir (ISENTRESS) 400 MG tablet Take 400 mg by mouth 2 (two) times daily.    . ritonavir (NORVIR) 100 MG TABS Take 100 mg by mouth 2 (two) times daily with a meal.    . spironolactone (ALDACTONE) 25 MG tablet Take 1 tablet (25 mg total) by mouth daily. 90 tablet 3   No current facility-administered medications for this visit.     Past Surgical History  Procedure Laterality Date  . Cholecystectomy    . Tubal ligation  Allergies  Allergen Reactions  . Levofloxacin     REACTION: hives      Family History  Problem Relation Age of Onset  . Heart failure       Social History [name] reports that she has never smoked. She has never used smokeless tobacco. [name] has no alcohol history on file.   Review of Systems CONSTITUTIONAL: No weight loss, fever, chills, weakness or fatigue.  HEENT: Eyes: No visual loss, blurred vision, double vision or yellow sclerae.No hearing loss, sneezing, congestion, runny nose or sore throat.  SKIN: No rash or itching.  CARDIOVASCULAR: per HPI RESPIRATORY: No shortness of breath, cough or sputum.  GASTROINTESTINAL: No anorexia, nausea, vomiting or diarrhea. No abdominal pain or blood.  GENITOURINARY: No burning on urination, no  polyuria NEUROLOGICAL: No headache, dizziness, syncope, paralysis, ataxia, numbness or tingling in the extremities. No change in bowel or bladder control.  MUSCULOSKELETAL: hip pain.  LYMPHATICS: No enlarged nodes. No history of splenectomy.  PSYCHIATRIC: No history of depression or anxiety.  ENDOCRINOLOGIC: No reports of sweating, cold or heat intolerance. No polyuria or polydipsia.  Marland Kitchen   Physical Examination p 82 bp 139/83 Wt 146 lbs BMI 28 Gen: resting comfortably, no acute distress HEENT: no scleral icterus, pupils equal round and reactive, no palptable cervical adenopathy,  CV: RRR, no m/r/g, no JVD, no carotid bruits Resp: Clear to auscultation bilaterally GI: abdomen is soft, non-tender, non-distended, normal bowel sounds, no hepatosplenomegaly MSK: extremities are warm, no edema.  Skin: warm, no rash Neuro:  no focal deficits Psych: appropriate affect   Diagnostic Studies 08/02/13 Echo Study Conclusions  - Left ventricle: Mild hypokinesis of the anterior septum. Moderate hypokinesis base inferior-septal segment. Inferior wall difficult to fully assess. Images suggest significant hypokinesis. The EF is in the 40-45% raqnge. The cavity size was mildly dilated. Wall thickness was increased in a pattern of mild LVH. Doppler parameters are consistent with abnormal left ventricular relaxation (grade 1 diastolic dysfunction). - Mitral valve: Trivial regurgitation. - Left atrium: The atrium was mildly dilated. - Right ventricle: The cavity size was normal. Systolic function was normal.    Assessment and Plan  1. NICM/Chronic systolic HF - LVEF has improved to 40-45%, she is NYHA I. She is euvolemic today.  - continue current medications. It is unclear why or when she stopped her lisionpril. Will check BMET, if ok restart lisinopril.   2. HTN - at goal, continue current meds  3. Hyperlipidemia - intolerant to several statins, doing well on lovastatin will  continue  4. Preoperative evaluation - being considered for intermediate risk ortho surgery. She has no active acute cardiac conditions. She tolerates greater than 4 METs regularly without limitation or symptoms. Recommend proceeding with surgery as planned, no indication for further cardiac testing or intereventions prior. Continue cardiac meds in periop period.     F/u 6 months. Order BMET, pending results restart ACE-I   Antoine Poche, M.D.

## 2014-05-10 NOTE — Patient Instructions (Signed)
   Lab for BMET  Office will contact with results via phone or letter.   Continue all current medications. Your physician wants you to follow up in: 6 months.  You will receive a reminder letter in the mail one-two months in advance.  If you don't receive a letter, please call our office to schedule the follow up appointment   

## 2014-06-26 ENCOUNTER — Encounter: Payer: Self-pay | Admitting: *Deleted

## 2014-07-17 ENCOUNTER — Telehealth: Payer: Self-pay | Admitting: *Deleted

## 2014-07-17 DIAGNOSIS — I10 Essential (primary) hypertension: Secondary | ICD-10-CM

## 2014-07-17 NOTE — Telephone Encounter (Signed)
Please advise if you want her to resume her Lisinopril.

## 2014-07-17 NOTE — Telephone Encounter (Signed)
Care everywhere investigation for most recent labs.  We never received from PMD even after faxing request.   Call placed to patient to see if she had them done.    These labs were done prior to her hip surgery with [facility].    BASIC METABOLIC PANEL (BMP) - Final result (06/29/2014 3:33 AM) BASIC METABOLIC PANEL (BMP) - Final result (06/29/2014 3:33 AM) Component Value Range Sodium 133 (L) 135-145 MMOL/L Potassium 4.1 3.5-5.3 MMOL/L Chloride 97 95-107 MMOL/L CO2 22 21-31 MMOL/L Urea Nitrogen 11 6-20 MG/DL Creatinine 0.56  Comment:  Creatinine results have been standardized to a reference material that is traceable to IDMS method.   0.5-1.2 MG/DL Glucose, Bld 113 (H)Comment:  Non fasting glucose range:70-179 mg/dL Impaired fasting Glucose:100-125 mg/dL   70-99 mg/dL Calcium 9.0 8.5-10.7 MG/DL Anion Gap 14 8-18 mmol/L Osmolality Calc 267 (L) 275-301 MOS/KG Bun/Creatinine 19.6 7-20 Glom Filt Rate, Estimated >90 >60 ml/min/1.78m GFR, Estim African American >90Comment:  (Note) The estimated GFR is a calculation valid for adults (1104to 58years old) that uses the CKD-EPI algorithm to adjust for age and sex. It is not to be used for children, pregnant women, hospitalized patients, patients on dialysis, or with rapidly changing kidney function. According to the NKDEP, eGFR >89 is normal, 60-89 shows mild impairment, 30-59 shows moderate impairment, 15-29 shows severe impairment and <15 is ESRD. Lab studies performed by: [facility] [place] [address]

## 2014-07-18 NOTE — Telephone Encounter (Signed)
Please varify that she has not have troubles with tongue swelling or throat swelling on any medicines before (aka angioedema). From a labs standpoint she is fine to be back on the lisinopril, throat swelling would be the only other reason not to start. If no episodes, then start lisinopril 5mg  daily with BMET in 2 weeks.   J Alantra Popoca MD

## 2014-07-23 MED ORDER — LISINOPRIL 5 MG PO TABS: 5.0000 mg | ORAL_TABLET | Freq: Every day | ORAL | Status: DC

## 2014-07-23 NOTE — Telephone Encounter (Signed)
Patient notified & confirmed no problems previously with this medication.  Will send new rx to [facility] on [address] in [place], [place] at her request.  Will mail lab order to her home as she prefers to get done at her PMD.

## 2014-07-24 ENCOUNTER — Other Ambulatory Visit: Payer: Self-pay | Admitting: Radiology

## 2014-07-24 DIAGNOSIS — I428 Other cardiomyopathies: Secondary | ICD-10-CM

## 2014-08-02 ENCOUNTER — Other Ambulatory Visit: Payer: Medicare Other

## 2014-08-13 ENCOUNTER — Encounter: Payer: Self-pay | Admitting: *Deleted

## 2014-08-22 ENCOUNTER — Other Ambulatory Visit: Payer: Self-pay

## 2014-08-22 ENCOUNTER — Other Ambulatory Visit (INDEPENDENT_AMBULATORY_CARE_PROVIDER_SITE_OTHER): Payer: Medicare Other

## 2014-08-22 DIAGNOSIS — I509 Heart failure, unspecified: Secondary | ICD-10-CM

## 2014-08-22 DIAGNOSIS — I428 Other cardiomyopathies: Secondary | ICD-10-CM

## 2014-08-27 ENCOUNTER — Telehealth: Payer: Self-pay | Admitting: *Deleted

## 2014-08-27 NOTE — Telephone Encounter (Signed)
-----   Message from Antoine Poche, MD sent at 08/27/2014  9:41 AM EDT ----- Echo overall is unchanged, the pumping function of her heart remains mildly weak, similar to before  Penny Ferry MD

## 2014-08-27 NOTE — Telephone Encounter (Signed)
Pt made aware, forwarded to Dr. Louellen Molder

## 2014-10-25 ENCOUNTER — Encounter: Payer: Self-pay | Admitting: *Deleted

## 2014-10-25 ENCOUNTER — Encounter: Payer: Self-pay | Admitting: Cardiology

## 2014-10-25 ENCOUNTER — Ambulatory Visit (INDEPENDENT_AMBULATORY_CARE_PROVIDER_SITE_OTHER): Payer: Medicare Other | Admitting: Cardiology

## 2014-10-25 VITALS — BP 133/85 | HR 69 | Ht 61.0 in | Wt 148.0 lb

## 2014-10-25 DIAGNOSIS — I10 Essential (primary) hypertension: Secondary | ICD-10-CM

## 2014-10-25 DIAGNOSIS — I5022 Chronic systolic (congestive) heart failure: Secondary | ICD-10-CM

## 2014-10-25 DIAGNOSIS — E785 Hyperlipidemia, unspecified: Secondary | ICD-10-CM | POA: Diagnosis not present

## 2014-10-25 NOTE — Progress Notes (Signed)
Clinical Summary Ms. Alviar is a 59 y.o.female seen today for follow up of the following medical problems.   1. NICM  - history of LVEF 20%, cath 2009 with patent coronaries per notes - echo 07/2013 with LVEF 40-45%, grade I diastolic dysfunction.  - echo 07/2014 LVEF 40%, grade I diastolic dysfunction - denies any SOB or DOE. Denies any exertional symptoms - limiting sodium intake, compliant with meds - denies any LE edema.   2. HTN - compliant with meds - does not check bp at home   3. Hyperlipidemia - tolerating lovastatin, previous muscle aches on other statins including pravastatin.  - 09/2013 TC 207 TG 234 HDL 33 LDL 127  4. OA - left hip replacement 06/2014, recovering well.    Past Medical History  Diagnosis Date  . Nonischemic cardiomyopathy     A.  normal coronary arteries, January 2009 B.  ejection fraction 20%; global hypokinesis  . Chronic systolic heart failure   . Essential hypertension, benign   . Nonsustained ventricular tachycardia     declined ICD therapy  . Chronic anemia   . History of alcohol abuse   . Allergic sinusitis   . HIV (human immunodeficiency virus infection)     undetectable viral load 2011/followed at [facility]     Allergies  Allergen Reactions  . Levofloxacin     REACTION: hives     Current Outpatient Prescriptions  Medication Sig Dispense Refill  . carvedilol (COREG) 25 MG tablet Take 1 tablet (25 mg total) by mouth 2 (two) times daily. 180 tablet 3  . Cholecalciferol (VITAMIN D-3) 1000 UNITS CAPS Take 1 capsule by mouth daily.    . darunavir (PREZISTA) 600 MG tablet Take 600 mg by mouth 2 (two) times daily with a meal.    . ferrous sulfate 325 (65 FE) MG tablet Take 1 tablet (325 mg total) by mouth daily with breakfast. 30 tablet 6  . furosemide (LASIX) 40 MG tablet Take 40 mg by mouth daily as needed.     Marland Kitchen ibuprofen (ADVIL,MOTRIN) 400 MG tablet Take 200-400 mg by mouth 2 (two) times daily as needed.    .  lamiVUDine-zidovudine (COMBIVIR) 150-300 MG per tablet Take 1 tablet by mouth 2 (two) times daily.    Marland Kitchen lisinopril (PRINIVIL,ZESTRIL) 5 MG tablet Take 1 tablet (5 mg total) by mouth daily. 30 tablet 6  . loratadine (CLARITIN) 10 MG tablet Take 10 mg by mouth daily.    Marland Kitchen lovastatin (MEVACOR) 20 MG tablet Take 1 tablet (20 mg total) by mouth daily. 30 tablet 6  . raltegravir (ISENTRESS) 400 MG tablet Take 400 mg by mouth 2 (two) times daily.    . ritonavir (NORVIR) 100 MG TABS Take 100 mg by mouth 2 (two) times daily with a meal.    . spironolactone (ALDACTONE) 25 MG tablet Take 1 tablet (25 mg total) by mouth daily. 90 tablet 3   No current facility-administered medications for this visit.     Past Surgical History  Procedure Laterality Date  . Cholecystectomy    . Tubal ligation       Allergies  Allergen Reactions  . Levofloxacin     REACTION: hives      Family History  Problem Relation Age of Onset  . Heart failure       Social History Ms. Lora reports that she has never smoked. She has never used smokeless tobacco. Ms. Ehresman has no alcohol history on file.   Review  of Systems CONSTITUTIONAL: No weight loss, fever, chills, weakness or fatigue.  HEENT: Eyes: No visual loss, blurred vision, double vision or yellow sclerae.No hearing loss, sneezing, congestion, runny nose or sore throat.  SKIN: No rash or itching.  CARDIOVASCULAR: per HPI RESPIRATORY: No shortness of breath, cough or sputum.  GASTROINTESTINAL: No anorexia, nausea, vomiting or diarrhea. No abdominal pain or blood.  GENITOURINARY: No burning on urination, no polyuria NEUROLOGICAL: No headache, dizziness, syncope, paralysis, ataxia, numbness or tingling in the extremities. No change in bowel or bladder control.  MUSCULOSKELETAL: No muscle, back pain, joint pain or stiffness.  LYMPHATICS: No enlarged nodes. No history of splenectomy.  PSYCHIATRIC: No history of depression or anxiety.  ENDOCRINOLOGIC:  No reports of sweating, cold or heat intolerance. No polyuria or polydipsia.  Marland Kitchen   Physical Examination Filed Vitals:   10/25/14 0810  BP: 133/85  Pulse: 69   Filed Vitals:   10/25/14 0810  Height: 5\' 1"  (1.549 m)  Weight: 148 lb (67.132 kg)    Gen: resting comfortably, no acute distress HEENT: no scleral icterus, pupils equal round and reactive, no palptable cervical adenopathy,  CV: RRR, no m/r/g, no JVD Resp: Clear to auscultation bilaterally GI: abdomen is soft, non-tender, non-distended, normal bowel sounds, no hepatosplenomegaly MSK: extremities are warm, no edema.  Skin: warm, no rash Neuro:  no focal deficits Psych: appropriate affect   Diagnostic Studies 08/02/13 Echo Study Conclusions  - Left ventricle: Mild hypokinesis of the anterior septum. Moderate hypokinesis base inferior-septal segment. Inferior wall difficult to fully assess. Images suggest significant hypokinesis. The EF is in the 40-45% raqnge. The cavity size was mildly dilated. Wall thickness was increased in a pattern of mild LVH. Doppler parameters are consistent with abnormal left ventricular relaxation (grade 1 diastolic dysfunction). - Mitral valve: Trivial regurgitation. - Left atrium: The atrium was mildly dilated. - Right ventricle: The cavity size was normal. Systolic function was normal.   07/2014 Echo Study Conclusions  - Left ventricle: The cavity size was normal. There was mild concentric hypertrophy. Systolic function was mildly to moderately reduced. The estimated ejection fraction was approximately 40%. Doppler parameters are consistent with abnormal left ventricular relaxation (grade 1 diastolic dysfunction). - Regional wall motion abnormality: Moderate hypokinesis of the mid anterior, mid anteroseptal, basal inferior, and mid anterolateral myocardium. Apex not well visualized. - Aortic valve: Mildly calcified annulus. Mildly thickened leaflets. - Mitral  valve: Mildly calcified annulus. Mildly thickened leaflets . There was mild regurgitation.    Assessment and Plan  1. NICM/Chronic systolic HF - LVEF has improved to 40-45%, she is NYHA I. She is euvolemic today.  - continue current medications.  2. HTN - at goal, continue current meds. Has had borderline high K in the past, will not titrate ACE-I at this time  3. Hyperlipidemia - intolerant to several statins, doing well on lovastatin will continue   F/u 6 months     Antoine Poche, M.D.

## 2014-10-25 NOTE — Patient Instructions (Signed)

## 2015-01-01 ENCOUNTER — Encounter: Payer: Medicare Other | Admitting: Cardiology

## 2015-01-15 ENCOUNTER — Other Ambulatory Visit: Payer: Self-pay | Admitting: *Deleted

## 2015-01-15 MED ORDER — LISINOPRIL 5 MG PO TABS: 5.0000 mg | ORAL_TABLET | Freq: Every day | ORAL | Status: AC

## 2015-02-01 ENCOUNTER — Encounter: Payer: Self-pay | Admitting: *Deleted

## 2015-02-01 ENCOUNTER — Ambulatory Visit (INDEPENDENT_AMBULATORY_CARE_PROVIDER_SITE_OTHER): Payer: Medicare Other | Admitting: Cardiology

## 2015-02-01 ENCOUNTER — Encounter: Payer: Self-pay | Admitting: Cardiology

## 2015-02-01 VITALS — BP 117/75 | HR 75 | Ht 61.0 in | Wt 149.0 lb

## 2015-02-01 DIAGNOSIS — I10 Essential (primary) hypertension: Secondary | ICD-10-CM

## 2015-02-01 NOTE — Progress Notes (Signed)
Patient ID: Penny [name], female   DOB: November 06, 1955, 58 y.o.   MRN: [id number]     Clinical Summary Penny [name] is a 59 y.o.female seen today for follow up of the following medical problems.    1. NICM  - history of LVEF 20%, cath 2009 with patent coronaries per notes - echo 07/2013 with LVEF 40-45%, grade I diastolic dysfunction.  - echo 07/2014 LVEF 40%, grade I diastolic dysfunction  - denies any SOB, no LE edema - compliant with meds   2. HTN - compliant with meds - does not check bp at home   3. Hyperlipidemia - tolerating lovastatin, previous muscle aches on other statins including pravastatin.   4. 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   Past Medical History  Diagnosis Date  . Nonischemic cardiomyopathy     A.  normal coronary arteries, January 2009 B.  ejection fraction 20%; global hypokinesis  . Chronic systolic heart failure   . Essential hypertension, benign   . Nonsustained ventricular tachycardia     declined ICD therapy  . Chronic anemia   . History of alcohol abuse   . Allergic sinusitis   . HIV (human immunodeficiency virus infection)     undetectable viral load 2011/followed at [facility]     Allergies  Allergen Reactions  . Levofloxacin     REACTION: hives     Current Outpatient Prescriptions  Medication Sig Dispense Refill  . alendronate (FOSAMAX) 70 MG tablet Take 1 tablet by mouth once a week.    . carvedilol (COREG) 25 MG tablet Take 1 tablet (25 mg total) by mouth 2 (two) times daily. 180 tablet 3  . Cholecalciferol (VITAMIN D-3) 1000 UNITS CAPS Take 1 capsule by mouth daily.    . [facility] CALCIUM 250-400 MG-UNIT CHEW Chew 1 tablet by mouth 2 (two) times daily.  6  . EDURANT 25 MG TABS tablet Take 25 mg by mouth daily.  5  . ferrous sulfate 325 (65 FE) MG tablet Take 1 tablet (325 mg total) by mouth daily with breakfast. 30 tablet 6  . furosemide (LASIX)  40 MG tablet Take 40 mg by mouth daily as needed.     Marland Kitchen ibuprofen (ADVIL,MOTRIN) 400 MG tablet Take 200-400 mg by mouth 2 (two) times daily as needed.    Marland Kitchen lisinopril (PRINIVIL,ZESTRIL) 5 MG tablet Take 1 tablet (5 mg total) by mouth daily. 30 tablet 6  . loratadine (CLARITIN) 10 MG tablet Take 10 mg by mouth daily.    Marland Kitchen lovastatin (MEVACOR) 20 MG tablet Take 1 tablet (20 mg total) by mouth daily. 30 tablet 6  . spironolactone (ALDACTONE) 25 MG tablet Take 1 tablet (25 mg total) by mouth daily. 90 tablet 3  . TIVICAY 50 MG tablet Take 1 tablet by mouth daily.  5  . VIREAD 300 MG tablet Take 300 mg by mouth daily.  5   No current facility-administered medications for this visit.     Past Surgical History  Procedure Laterality Date  . Cholecystectomy    . Tubal ligation       Allergies  Allergen Reactions  . Levofloxacin     REACTION: hives      Family History  Problem Relation Age of Onset  . Heart failure       Social History Penny [name] reports that she has never smoked. She has never used smokeless tobacco. Penny [name] has no alcohol  history on file.   Review of Systems CONSTITUTIONAL: No weight loss, fever, chills, weakness or fatigue.  HEENT: Eyes: No visual loss, blurred vision, double vision or yellow sclerae.No hearing loss, sneezing, congestion, runny nose or sore throat.  SKIN: No rash or itching.  CARDIOVASCULAR: per HPI RESPIRATORY: No shortness of breath, cough or sputum.  GASTROINTESTINAL: No anorexia, nausea, vomiting or diarrhea. No abdominal pain or blood.  GENITOURINARY: No burning on urination, no polyuria NEUROLOGICAL: No headache, dizziness, syncope, paralysis, ataxia, numbness or tingling in the extremities. No change in bowel or bladder control.  MUSCULOSKELETAL: hip pain LYMPHATICS: No enlarged nodes. No history of splenectomy.  PSYCHIATRIC: No history of depression or anxiety.  ENDOCRINOLOGIC: No reports of sweating, cold or heat intolerance.  No polyuria or polydipsia.  Marland Kitchen   Physical Examination Filed Vitals:   02/01/15 0810  BP: 117/75  Pulse: 75   Filed Vitals:   02/01/15 0810  Height: 5\' 1"  (1.549 m)  Weight: 149 lb (67.586 kg)    Gen: resting comfortably, no acute distress HEENT: no scleral icterus, pupils equal round and reactive, no palptable cervical adenopathy,  CV: RRR, no m/r/g, no jvd Resp: Clear to auscultation bilaterally GI: abdomen is soft, non-tender, non-distended, normal bowel sounds, no hepatosplenomegaly MSK: extremities are warm, no edema.  Skin: warm, no rash Neuro:  no focal deficits Psych: appropriate affect   Diagnostic Studies  08/02/13 Echo Study Conclusions  - Left ventricle: Mild hypokinesis of the anterior septum. Moderate hypokinesis base inferior-septal segment. Inferior wall difficult to fully assess. Images suggest significant hypokinesis. The EF is in the 40-45% raqnge. The cavity size was mildly dilated. Wall thickness was increased in a pattern of mild LVH. Doppler parameters are consistent with abnormal left ventricular relaxation (grade 1 diastolic dysfunction). - Mitral valve: Trivial regurgitation. - Left atrium: The atrium was mildly dilated. - Right ventricle: The cavity size was normal. Systolic function was normal.   07/2014 Echo Study Conclusions  - Left ventricle: The cavity size was normal. There was mild concentric hypertrophy. Systolic function was mildly to moderately reduced. The estimated ejection fraction was approximately 40%. Doppler parameters are consistent with abnormal left ventricular relaxation (grade 1 diastolic dysfunction). - Regional wall motion abnormality: Moderate hypokinesis of the mid anterior, mid anteroseptal, basal inferior, and mid anterolateral myocardium. Apex not well visualized. - Aortic valve: Mildly calcified annulus. Mildly thickened leaflets. - Mitral valve: Mildly calcified annulus. Mildly thickened  leaflets . There was mild regurgitation.   Assessment and Plan  1. NICM/Chronic systolic HF - LVEF has improved to 40-45%, she is NYHA I. No evidence of volume overload today - continue current medications.  2. HTN - at goal, continue current meds. Has had borderline high K in the past, will not titrate ACE-I at this time  3. Hyperlipidemia - intolerant to several statins, doing well on lovastatin will continue  4. Preoperative evaluation - patient is being considered for hip replacement. Tolerated procedure well in 06/2014, now plans for the other hip. From cardiac standpoint recommend proceeding with procedure.   F/u 6 months   Antoine Poche, M.D.

## 2015-02-01 NOTE — Patient Instructions (Signed)
Your physician wants you to follow-up in: 6 months with Dr. Lurena Joiner will receive a reminder letter in the mail two months in advance. If you don't receive a letter, please call our office to schedule the follow-up appointment.  Your physician recommends that you continue on your current medications as directed. Please refer to the Current Medication list given to you today.  WE WILL REQUEST LAB WORK  Thank you for choosing [facility]!!

## 2015-08-06 ENCOUNTER — Ambulatory Visit: Payer: Medicare Other | Admitting: Cardiology

## 2015-08-06 NOTE — Progress Notes (Unsigned)
Patient ID: Penny [name], female   DOB: Aug 16, 1955, 60 y.o.   MRN: [id number]     Clinical Summary Ms. Fuente is a 60 y.o.female seen today for follow up of the following medical problems.   1. NICM  - history of LVEF 20%, cath 2009 with patent coronaries per notes - echo 07/2013 with LVEF 40-45%, grade I diastolic dysfunction.  - echo 07/2014 LVEF 40%, grade I diastolic dysfunction  - denies any SOB, no LE edema - compliant with meds   2. HTN - compliant with meds - does not check bp at home   3. Hyperlipidemia - tolerating lovastatin, previous muscle aches on other statins including pravastatin.   4. 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  Past Medical History  Diagnosis Date  . Nonischemic cardiomyopathy (HCC)     A.  normal coronary arteries, January 2009 B.  ejection fraction 20%; global hypokinesis  . Chronic systolic heart failure   . Essential hypertension, benign   . Nonsustained ventricular tachycardia     declined ICD therapy  . Chronic anemia   . History of alcohol abuse   . Allergic sinusitis   . HIV (human immunodeficiency virus infection)     undetectable viral load 2011/followed at [facility]     Allergies  Allergen Reactions  . Levofloxacin     REACTION: hives     Current Outpatient Prescriptions  Medication Sig Dispense Refill  . alendronate (FOSAMAX) 70 MG tablet Take 1 tablet by mouth once a week.    . carvedilol (COREG) 25 MG tablet Take 1 tablet (25 mg total) by mouth 2 (two) times daily. 180 tablet 3  . [facility] CALCIUM 250-400 MG-UNIT CHEW Chew 1 tablet by mouth 2 (two) times daily.  6  . EDURANT 25 MG TABS tablet Take 25 mg by mouth daily.  5  . ferrous sulfate 325 (65 FE) MG tablet Take 1 tablet (325 mg total) by mouth daily with breakfast. 30 tablet 6  . furosemide (LASIX) 40 MG tablet Take 40 mg by mouth daily as needed.     Marland Kitchen ibuprofen (ADVIL,MOTRIN)  400 MG tablet Take 200-400 mg by mouth 2 (two) times daily as needed.    Marland Kitchen lisinopril (PRINIVIL,ZESTRIL) 5 MG tablet Take 1 tablet (5 mg total) by mouth daily. 30 tablet 6  . loratadine (CLARITIN) 10 MG tablet Take 10 mg by mouth daily.    Marland Kitchen lovastatin (MEVACOR) 20 MG tablet Take 1 tablet (20 mg total) by mouth daily. 30 tablet 6  . spironolactone (ALDACTONE) 25 MG tablet Take 1 tablet (25 mg total) by mouth daily. 90 tablet 3  . TIVICAY 50 MG tablet Take 1 tablet by mouth daily.  5  . VIREAD 300 MG tablet Take 300 mg by mouth daily.  5   No current facility-administered medications for this visit.     Past Surgical History  Procedure Laterality Date  . Cholecystectomy    . Tubal ligation       Allergies  Allergen Reactions  . Levofloxacin     REACTION: hives      Family History  Problem Relation Age of Onset  . Heart failure       Social History Ms. Matthews reports that she has never smoked. She has never used smokeless tobacco. Ms. Zirpoli has no alcohol history on file.   Review of Systems CONSTITUTIONAL: No weight loss, fever, chills, weakness or fatigue.  HEENT: Eyes: No visual loss, blurred vision, double vision or yellow sclerae.No hearing loss, sneezing, congestion, runny nose or sore throat.  SKIN: No rash or itching.  CARDIOVASCULAR:  RESPIRATORY: No shortness of breath, cough or sputum.  GASTROINTESTINAL: No anorexia, nausea, vomiting or diarrhea. No abdominal pain or blood.  GENITOURINARY: No burning on urination, no polyuria NEUROLOGICAL: No headache, dizziness, syncope, paralysis, ataxia, numbness or tingling in the extremities. No change in bowel or bladder control.  MUSCULOSKELETAL: No muscle, back pain, joint pain or stiffness.  LYMPHATICS: No enlarged nodes. No history of splenectomy.  PSYCHIATRIC: No history of depression or anxiety.  ENDOCRINOLOGIC: No reports of sweating, cold or heat intolerance. No polyuria or polydipsia.  Marland Kitchen   Physical  Examination There were no vitals filed for this visit. There were no vitals filed for this visit.  Gen: resting comfortably, no acute distress HEENT: no scleral icterus, pupils equal round and reactive, no palptable cervical adenopathy,  CV Resp: Clear to auscultation bilaterally GI: abdomen is soft, non-tender, non-distended, normal bowel sounds, no hepatosplenomegaly MSK: extremities are warm, no edema.  Skin: warm, no rash Neuro:  no focal deficits Psych: appropriate affect   Diagnostic Studies  08/02/13 Echo Study Conclusions  - Left ventricle: Mild hypokinesis of the anterior septum. Moderate hypokinesis base inferior-septal segment. Inferior wall difficult to fully assess. Images suggest significant hypokinesis. The EF is in the 40-45% raqnge. The cavity size was mildly dilated. Wall thickness was increased in a pattern of mild LVH. Doppler parameters are consistent with abnormal left ventricular relaxation (grade 1 diastolic dysfunction). - Mitral valve: Trivial regurgitation. - Left atrium: The atrium was mildly dilated. - Right ventricle: The cavity size was normal. Systolic function was normal.   07/2014 Echo Study Conclusions  - Left ventricle: The cavity size was normal. There was mild concentric hypertrophy. Systolic function was mildly to moderately reduced. The estimated ejection fraction was approximately 40%. Doppler parameters are consistent with abnormal left ventricular relaxation (grade 1 diastolic dysfunction). - Regional wall motion abnormality: Moderate hypokinesis of the mid anterior, mid anteroseptal, basal inferior, and mid anterolateral myocardium. Apex not well visualized. - Aortic valve: Mildly calcified annulus. Mildly thickened leaflets. - Mitral valve: Mildly calcified annulus. Mildly thickened leaflets . There was mild regurgitation.    Assessment and Plan   1. NICM/Chronic systolic HF - LVEF has improved to  40-45%, she is NYHA I. No evidence of volume overload today - continue current medications.  2. HTN - at goal, continue current meds. Has had borderline high K in the past, will not titrate ACE-I at this time  3. Hyperlipidemia - intolerant to several statins, doing well on lovastatin will continue  4. Preoperative evaluation - patient is being considered for hip replacement. Tolerated procedure well in 06/2014, now plans for the other hip. From cardiac standpoint recommend proceeding with procedure.   F/u 6 months     Antoine Poche, M.D., F.A.C.C.

## 2015-08-14 ENCOUNTER — Ambulatory Visit (INDEPENDENT_AMBULATORY_CARE_PROVIDER_SITE_OTHER): Payer: Medicare Other | Admitting: Cardiology

## 2015-08-14 ENCOUNTER — Encounter: Payer: Self-pay | Admitting: Cardiology

## 2015-08-14 ENCOUNTER — Encounter: Payer: Self-pay | Admitting: *Deleted

## 2015-08-14 VITALS — BP 135/86 | HR 76 | Ht 61.0 in | Wt 164.0 lb

## 2015-08-14 DIAGNOSIS — I5022 Chronic systolic (congestive) heart failure: Secondary | ICD-10-CM | POA: Diagnosis not present

## 2015-08-14 DIAGNOSIS — I10 Essential (primary) hypertension: Secondary | ICD-10-CM | POA: Diagnosis not present

## 2015-08-14 DIAGNOSIS — E785 Hyperlipidemia, unspecified: Secondary | ICD-10-CM | POA: Diagnosis not present

## 2015-08-14 NOTE — Progress Notes (Signed)
Patient ID: Penny [name], female   DOB: April 11, 1956, 60 y.o.   MRN: [id number]     Clinical Summary Penny [name] is a 60 y.o.female seen today for follow up of the following medical problems.   1. NICM  - history of LVEF 20%, cath 2009 with patent coronaries per notes - echo 07/2013 with LVEF 40-45%, grade I diastolic dysfunction.  - echo 07/2014 LVEF 40%, grade I diastolic dysfunction  - denies and SOB or DOE. No LE edema. Compliant with meds.  - limiting sodium intake. Not checking weights at home.   2. HTN - compliant with meds - does not check bp at home regularly   3. Hyperlipidemia - tolerating lovastatin, previous muscle aches on other statins including pravastatin.  - reports recent labs with pcp She states based on results she was started on fish oil.  Past Medical History  Diagnosis Date  . Nonischemic cardiomyopathy (HCC)     A.  normal coronary arteries, January 2009 B.  ejection fraction 20%; global hypokinesis  . Chronic systolic heart failure   . Essential hypertension, benign   . Nonsustained ventricular tachycardia     declined ICD therapy  . Chronic anemia   . History of alcohol abuse   . Allergic sinusitis   . HIV (human immunodeficiency virus infection)     undetectable viral load 2011/followed at [facility]     Allergies  Allergen Reactions  . Levofloxacin     REACTION: hives     Current Outpatient Prescriptions  Medication Sig Dispense Refill  . alendronate (FOSAMAX) 70 MG tablet Take 1 tablet by mouth once a week.    . carvedilol (COREG) 25 MG tablet Take 1 tablet (25 mg total) by mouth 2 (two) times daily. 180 tablet 3  . CVS CALCIUM 250-400 MG-UNIT CHEW Chew 1 tablet by mouth 2 (two) times daily.  6  . EDURANT 25 MG TABS tablet Take 25 mg by mouth daily.  5  . ferrous sulfate 325 (65 FE) MG tablet Take 1 tablet (325 mg total) by mouth daily with breakfast. 30 tablet 6  . furosemide (LASIX) 40 MG tablet Take 40 mg by mouth daily  as needed.     Marland Kitchen ibuprofen (ADVIL,MOTRIN) 400 MG tablet Take 200-400 mg by mouth 2 (two) times daily as needed.    Marland Kitchen lisinopril (PRINIVIL,ZESTRIL) 5 MG tablet Take 1 tablet (5 mg total) by mouth daily. 30 tablet 6  . loratadine (CLARITIN) 10 MG tablet Take 10 mg by mouth daily.    Marland Kitchen lovastatin (MEVACOR) 20 MG tablet Take 1 tablet (20 mg total) by mouth daily. 30 tablet 6  . spironolactone (ALDACTONE) 25 MG tablet Take 1 tablet (25 mg total) by mouth daily. 90 tablet 3  . TIVICAY 50 MG tablet Take 1 tablet by mouth daily.  5  . VIREAD 300 MG tablet Take 300 mg by mouth daily.  5   No current facility-administered medications for this visit.     Past Surgical History  Procedure Laterality Date  . Cholecystectomy    . Tubal ligation       Allergies  Allergen Reactions  . Levofloxacin     REACTION: hives      Family History  Problem Relation Age of Onset  . Heart failure       Social History Penny [name] reports that she has never smoked. She has never used smokeless tobacco. Penny [name] has no alcohol history on file.   Review of Systems  CONSTITUTIONAL: No weight loss, fever, chills, weakness or fatigue.  HEENT: Eyes: No visual loss, blurred vision, double vision or yellow sclerae.No hearing loss, sneezing, congestion, runny nose or sore throat.  SKIN: No rash or itching.  CARDIOVASCULAR: per HPI RESPIRATORY: No shortness of breath, cough or sputum.  GASTROINTESTINAL: No anorexia, nausea, vomiting or diarrhea. No abdominal pain or blood.  GENITOURINARY: No burning on urination, no polyuria NEUROLOGICAL: No headache, dizziness, syncope, paralysis, ataxia, numbness or tingling in the extremities. No change in bowel or bladder control.  MUSCULOSKELETAL: No muscle, back pain, joint pain or stiffness.  LYMPHATICS: No enlarged nodes. No history of splenectomy.  PSYCHIATRIC: No history of depression or anxiety.  ENDOCRINOLOGIC: No reports of sweating, cold or heat intolerance.  No polyuria or polydipsia.  Marland Kitchen   Physical Examination Filed Vitals:   08/14/15 0847  BP: 135/86  Pulse: 76   Filed Vitals:   08/14/15 0847  Height:  (1.549 m)  Weight: 164 lb (74.39 kg)    Gen: resting comfortably, no acute distress HEENT: no scleral icterus, pupils equal round and reactive, no palptable cervical adenopathy,  CV: RRR, no m/r/g, no jvd Resp: Clear to auscultation bilaterally GI: abdomen is soft, non-tender, non-distended, normal bowel sounds, no hepatosplenomegaly MSK: extremities are warm, no edema.  Skin: warm, no rash Neuro:  no focal deficits Psych: appropriate affect   Diagnostic Studies 08/02/13 Echo Study Conclusions  - Left ventricle: Mild hypokinesis of the anterior septum. Moderate hypokinesis base inferior-septal segment. Inferior wall difficult to fully assess. Images suggest significant hypokinesis. The EF is in the 40-45% raqnge. The cavity size was mildly dilated. Wall thickness was increased in a pattern of mild LVH. Doppler parameters are consistent with abnormal left ventricular relaxation (grade 1 diastolic dysfunction). - Mitral valve: Trivial regurgitation. - Left atrium: The atrium was mildly dilated. - Right ventricle: The cavity size was normal. Systolic function was normal.   07/2014 Echo Study Conclusions  - Left ventricle: The cavity size was normal. There was mild concentric hypertrophy. Systolic function was mildly to moderately reduced. The estimated ejection fraction was approximately 40%. Doppler parameters are consistent with abnormal left ventricular relaxation (grade 1 diastolic dysfunction). - Regional wall motion abnormality: Moderate hypokinesis of the mid anterior, mid anteroseptal, basal inferior, and mid anterolateral myocardium. Apex not well visualized. - Aortic valve: Mildly calcified annulus. Mildly thickened leaflets. - Mitral valve: Mildly calcified annulus. Mildly thickened  leaflets . There was mild regurgitation.      Assessment and Plan   1. NICM/Chronic systolic HF - LVEF has improved to 40-45%, she is NYHA I. No evidence of volume overload in clinic - we will continue current medications.  2. HTN - at goal, continue current meds. - Has had borderline high K in the past, will not titrate ACE-I at this time  3. Hyperlipidemia - intolerant to several statins, doing well on lovastatin - request recent lipid panel from pcp, pending results if needed could try higher dose of lovastatin   F/u 6 months   Antoine Poche, M.D.

## 2015-08-14 NOTE — Patient Instructions (Signed)
Continue all current medications. Your physician wants you to follow up in: 6 months.  You will receive a reminder letter in the mail one-two months in advance.  If you don't receive a letter, please call our office to schedule the follow up appointment   

## 2016-02-14 ENCOUNTER — Encounter: Payer: Self-pay | Admitting: *Deleted

## 2016-02-17 ENCOUNTER — Encounter: Payer: Self-pay | Admitting: Cardiology

## 2016-02-17 ENCOUNTER — Ambulatory Visit (INDEPENDENT_AMBULATORY_CARE_PROVIDER_SITE_OTHER): Payer: Medicare Other | Admitting: Cardiology

## 2016-02-17 VITALS — BP 152/90 | HR 91 | Ht 61.0 in | Wt 167.0 lb

## 2016-02-17 DIAGNOSIS — I5022 Chronic systolic (congestive) heart failure: Secondary | ICD-10-CM | POA: Diagnosis not present

## 2016-02-17 DIAGNOSIS — E785 Hyperlipidemia, unspecified: Secondary | ICD-10-CM | POA: Diagnosis not present

## 2016-02-17 DIAGNOSIS — I10 Essential (primary) hypertension: Secondary | ICD-10-CM | POA: Diagnosis not present

## 2016-02-17 NOTE — Progress Notes (Addendum)
Clinical Summary Ms. Jim LikeMillner is a 60 y.o.female seen today for follow up of the following medical problems.   1. NICM  - history of LVEF 20%, cath 2009 with patent coronaries per notes - echo 07/2013 with LVEF 40-45%, grade I diastolic dysfunction.  - echo 07/2014 LVEF 40%, grade I diastolic dysfunction  - no SOB or DOE. No recent LE edema.  - compliant with meds, but forgot to take today.   2. HTN - does not check bp at home regularly   3. Hyperlipidemia - tolerating lovastatin, previous muscle aches on other statins including pravastatin.  - most recent labs by pcp per her report     SH: recent fall at her daughters house, fell and hurt her knee. Past Medical History:  Diagnosis Date  . Allergic sinusitis   . Chronic anemia   . Chronic systolic heart failure (HCC)   . Essential hypertension, benign   . History of alcohol abuse   . HIV (human immunodeficiency virus infection) (HCC)    undetectable [facility] load [facility]  . Nonischemic cardiomyopathy [facility](HCC)    A.  normal coronary arteries, January 2009 B.  ejection fraction 20%; global hypokinesis  . Nonsustained ventricular tachycardia (HCC)    declined ICD therapy     Allergies  Allergen Reactions  . Levofloxacin     REACTION: hives     Current Outpatient Prescriptions  Medication Sig Dispense Refill  . alendronate (FOSAMAX) 70 MG tablet Take 1 tablet by mouth once a week.    . carvedilol (COREG) 25 MG tablet Take 1 tablet (25 mg total) by mouth 2 (two) times daily. 180 tablet 3  . CVS CALCIUM 250-400 MG-UNIT CHEW Chew 1 tablet by mouth 2 (two) times daily.  6  . CVS VITAMIN D3 1000 units capsule Take 1,000 Units by mouth daily.   7  . EDURANT 25 MG TABS tablet Take 25 mg by mouth daily.  5  . ESTRACE VAGINAL 0.1 MG/GM vaginal cream Place 1 application vaginally 2 (two) times a week.    . ferrous sulfate 325 (65 FE) MG tablet Take 1 tablet (325 mg total) by mouth daily with  breakfast. 30 tablet 6  . furosemide (LASIX) 40 MG tablet Take 40 mg by mouth daily as needed.     Marland Kitchen. ibuprofen (ADVIL,MOTRIN) 400 MG tablet Take 200-400 mg by mouth 2 (two) times daily as needed.    Marland Kitchen. lisinopril (PRINIVIL,ZESTRIL) 5 MG tablet Take 1 tablet (5 mg total) by mouth daily. 30 tablet 6  . loratadine (CLARITIN) 10 MG tablet Take 10 mg by mouth daily.    Marland Kitchen. lovastatin (MEVACOR) 20 MG tablet Take 1 tablet (20 mg total) by mouth daily. 30 tablet 6  . meloxicam (MOBIC) 15 MG tablet Take 1 tablet by mouth daily.  3  . spironolactone (ALDACTONE) 25 MG tablet Take 1 tablet (25 mg total) by mouth daily. 90 tablet 3  . TIVICAY 50 MG tablet Take 1 tablet by mouth daily.  5  . VIREAD 300 MG tablet Take 300 mg by mouth daily.  5   No current facility-administered medications for this visit.      Past Surgical History:  Procedure Laterality Date  . CHOLECYSTECTOMY    . TUBAL LIGATION       Allergies  Allergen Reactions  . Levofloxacin     REACTION: hives      Family History  Problem Relation Age of Onset  . Heart failure  Social History Ms. Crager reports that she has never smoked. She has never used smokeless tobacco. Ms. Trivitt has no alcohol history on file.   Review of Systems CONSTITUTIONAL: No weight loss, fever, chills, weakness or fatigue.  HEENT: Eyes: No visual loss, blurred vision, double vision or yellow sclerae.No hearing loss, sneezing, congestion, runny nose or sore throat.  SKIN: No rash or itching.  CARDIOVASCULAR: per HPI RESPIRATORY: No shortness of breath, cough or sputum.  GASTROINTESTINAL: No anorexia, nausea, vomiting or diarrhea. No abdominal pain or blood.  GENITOURINARY: No burning on urination, no polyuria NEUROLOGICAL: No headache, dizziness, syncope, paralysis, ataxia, numbness or tingling in the extremities. No change in bowel or bladder control.  MUSCULOSKELETAL: No muscle, back pain, joint pain or stiffness.  LYMPHATICS: No  enlarged nodes. No history of splenectomy.  PSYCHIATRIC: No history of depression or anxiety.  ENDOCRINOLOGIC: No reports of sweating, cold or heat intolerance. No polyuria or polydipsia.  Marland Kitchen   Physical Examination Vitals:   02/17/16 1058  BP: (!) 152/90  Pulse: 91   Vitals:   02/17/16 1058  Weight: 167 lb (75.8 kg)  Height: 5\' 1"  (1.549 m)    Gen: resting comfortably, no acute distress HEENT: no scleral icterus, pupils equal round and reactive, no palptable cervical adenopathy,  CV: RRR, no m/r/g, no jvd Resp: Clear to auscultation bilaterally GI: abdomen is soft, non-tender, non-distended, normal bowel sounds, no hepatosplenomegaly MSK: extremities are warm, no edema.  Skin: warm, no rash Neuro:  no focal deficits Psych: appropriate affect   Diagnostic Studies 08/02/13 Echo Study Conclusions  - Left ventricle: Mild hypokinesis of the anterior septum. Moderate hypokinesis base inferior-septal segment. Inferior wall difficult to fully assess. Images suggest significant hypokinesis. The EF is in the 40-45% raqnge. The cavity size was mildly dilated. Wall thickness was increased in a pattern of mild LVH. Doppler parameters are consistent with abnormal left ventricular relaxation (grade 1 diastolic dysfunction). - Mitral valve: Trivial regurgitation. - Left atrium: The atrium was mildly dilated. - Right ventricle: The cavity size was normal. Systolic function was normal.   07/2014 Echo Study Conclusions  - Left ventricle: The cavity size was normal. There was mild concentric hypertrophy. Systolic function was mildly to moderately reduced. The estimated ejection fraction was approximately 40%. Doppler parameters are consistent with abnormal left ventricular relaxation (grade 1 diastolic dysfunction). - Regional wall motion abnormality: Moderate hypokinesis of the mid anterior, mid anteroseptal, basal inferior, and mid anterolateral myocardium. Apex  not well visualized. - Aortic valve: Mildly calcified annulus. Mildly thickened leaflets. - Mitral valve: Mildly calcified annulus. Mildly thickened leaflets . There was mild regurgitation.      Assessment and Plan  1. NICM/Chronic systolic HF - LVEF has improved to 40-45%, she is NYHA I. - we will continue current meds. Have not titrate ACE-I further due to borderline elevated K in the past.  - EKG in clinic shows NSR   2. HTN - elevated, however she has not taken her meds yet today - has been controlled at prior visits - we will continue current meds  3. Hyperlipidemia - intolerant to several statins, doing well on lovastatin - request recent lipid panel from pcp   F/u 6 months      Antoine Poche, M.D.

## 2016-02-17 NOTE — Patient Instructions (Signed)

## 2016-02-26 ENCOUNTER — Encounter: Payer: Self-pay | Admitting: *Deleted

## 2017-08-18 ENCOUNTER — Telehealth: Payer: Self-pay

## 2017-08-18 NOTE — Telephone Encounter (Signed)
Numerous attempts to contact patient with recall letters. Unable to reach by telephone. with no success.  Penny [name] [[id number]] 02/17/2016 11:35 AM New [10]    [System] 04/24/2016 11:04 PM Notification Sent [20]   Penny [name] [[id number]] 12/16/2016 1:28 PM Notification Sent [20]   Penny [name] [[id number]] 08/18/2017 10:37 AM Notification Sent [20]
